# Patient Record
Sex: Female | Born: 1975 | Race: White | Hispanic: No | Marital: Single | Smoking: Former smoker
Health system: Southern US, Community
[De-identification: ages and names within clinical notes are randomized; demographics above are authoritative.]

## PROBLEM LIST (undated history)

## (undated) DIAGNOSIS — E7800 Pure hypercholesterolemia, unspecified: Secondary | ICD-10-CM

## (undated) HISTORY — PX: BACK SURGERY: SHX140

---

## 2010-06-28 ENCOUNTER — Emergency Department (HOSPITAL_COMMUNITY)
Admission: EM | Admit: 2010-06-28 | Discharge: 2010-06-28 | Payer: Self-pay | Source: Home / Self Care | Admitting: Emergency Medicine

## 2010-10-10 ENCOUNTER — Emergency Department (HOSPITAL_COMMUNITY)
Admission: EM | Admit: 2010-10-10 | Discharge: 2010-10-10 | Disposition: A | Discharge status: Home / Self Care | Payer: BC Managed Care – PPO | Attending: Emergency Medicine | Admitting: Emergency Medicine

## 2010-10-10 ENCOUNTER — Emergency Department (HOSPITAL_COMMUNITY): Payer: BC Managed Care – PPO

## 2010-10-10 DIAGNOSIS — E119 Type 2 diabetes mellitus without complications: Secondary | ICD-10-CM | POA: Insufficient documentation

## 2010-10-10 DIAGNOSIS — N390 Urinary tract infection, site not specified: Secondary | ICD-10-CM | POA: Insufficient documentation

## 2010-10-10 DIAGNOSIS — Z87442 Personal history of urinary calculi: Secondary | ICD-10-CM | POA: Insufficient documentation

## 2010-10-10 DIAGNOSIS — R300 Dysuria: Secondary | ICD-10-CM | POA: Insufficient documentation

## 2010-10-10 DIAGNOSIS — R109 Unspecified abdominal pain: Secondary | ICD-10-CM | POA: Insufficient documentation

## 2010-10-10 LAB — POCT I-STAT, CHEM 8
BUN: 8 mg/dL (ref 6–23)
Hemoglobin: 14.6 g/dL (ref 12.0–15.0)
Potassium: 3.7 mEq/L (ref 3.5–5.1)
Sodium: 138 mEq/L (ref 135–145)
TCO2: 26 mmol/L (ref 0–100)

## 2010-10-10 LAB — URINE MICROSCOPIC-ADD ON

## 2010-10-10 LAB — URINALYSIS, ROUTINE W REFLEX MICROSCOPIC
Ketones, ur: NEGATIVE mg/dL
Nitrite: NEGATIVE
Protein, ur: 100 mg/dL — AB
pH: 6.5 (ref 5.0–8.0)

## 2010-10-10 LAB — POCT PREGNANCY, URINE: Preg Test, Ur: NEGATIVE

## 2010-10-12 LAB — URINE CULTURE
Colony Count: >=100000
Culture  Setup Time: 201203182353

## 2010-11-24 ENCOUNTER — Ambulatory Visit: Payer: BC Managed Care – PPO | Admitting: Gynecology

## 2011-03-11 ENCOUNTER — Emergency Department (HOSPITAL_COMMUNITY)
Admission: EM | Admit: 2011-03-11 | Discharge: 2011-03-11 | Disposition: A | Discharge status: Home / Self Care | Payer: Self-pay | Attending: Emergency Medicine | Admitting: Emergency Medicine

## 2011-03-11 DIAGNOSIS — I10 Essential (primary) hypertension: Secondary | ICD-10-CM | POA: Insufficient documentation

## 2011-03-11 DIAGNOSIS — E119 Type 2 diabetes mellitus without complications: Secondary | ICD-10-CM | POA: Insufficient documentation

## 2011-03-11 DIAGNOSIS — M5430 Sciatica, unspecified side: Secondary | ICD-10-CM | POA: Insufficient documentation

## 2011-03-16 ENCOUNTER — Other Ambulatory Visit: Payer: Self-pay | Admitting: Physical Medicine and Rehabilitation

## 2011-03-16 DIAGNOSIS — R29898 Other symptoms and signs involving the musculoskeletal system: Secondary | ICD-10-CM

## 2011-03-16 DIAGNOSIS — R202 Paresthesia of skin: Secondary | ICD-10-CM

## 2011-03-21 ENCOUNTER — Ambulatory Visit
Admission: RE | Admit: 2011-03-21 | Discharge: 2011-03-21 | Disposition: A | Discharge status: Home / Self Care | Payer: Self-pay | Source: Ambulatory Visit | Attending: Physical Medicine and Rehabilitation | Admitting: Physical Medicine and Rehabilitation

## 2011-03-21 DIAGNOSIS — R202 Paresthesia of skin: Secondary | ICD-10-CM

## 2011-03-21 DIAGNOSIS — R29898 Other symptoms and signs involving the musculoskeletal system: Secondary | ICD-10-CM

## 2011-03-21 MED ORDER — GADOBENATE DIMEGLUMINE 529 MG/ML IV SOLN
20.0000 mL | Freq: Once | INTRAVENOUS | Status: AC | PRN
  Administered 2011-03-21: 20 mL via INTRAVENOUS

## 2011-05-21 ENCOUNTER — Emergency Department (HOSPITAL_COMMUNITY)
Admission: EM | Admit: 2011-05-21 | Discharge: 2011-05-21 | Discharge status: Against Medical Advice | Payer: Self-pay | Attending: Emergency Medicine | Admitting: Emergency Medicine

## 2011-05-21 DIAGNOSIS — Z0389 Encounter for observation for other suspected diseases and conditions ruled out: Secondary | ICD-10-CM | POA: Insufficient documentation

## 2011-07-25 ENCOUNTER — Encounter: Payer: Self-pay | Admitting: *Deleted

## 2011-07-25 ENCOUNTER — Emergency Department (HOSPITAL_COMMUNITY)
Admission: EM | Admit: 2011-07-25 | Discharge: 2011-07-25 | Disposition: A | Discharge status: Home / Self Care | Payer: Self-pay | Attending: Emergency Medicine | Admitting: Emergency Medicine

## 2011-07-25 ENCOUNTER — Emergency Department (HOSPITAL_COMMUNITY): Payer: Self-pay

## 2011-07-25 DIAGNOSIS — R112 Nausea with vomiting, unspecified: Secondary | ICD-10-CM | POA: Insufficient documentation

## 2011-07-25 DIAGNOSIS — F17200 Nicotine dependence, unspecified, uncomplicated: Secondary | ICD-10-CM | POA: Insufficient documentation

## 2011-07-25 DIAGNOSIS — H538 Other visual disturbances: Secondary | ICD-10-CM | POA: Insufficient documentation

## 2011-07-25 DIAGNOSIS — G43909 Migraine, unspecified, not intractable, without status migrainosus: Secondary | ICD-10-CM | POA: Insufficient documentation

## 2011-07-25 DIAGNOSIS — E119 Type 2 diabetes mellitus without complications: Secondary | ICD-10-CM | POA: Insufficient documentation

## 2011-07-25 DIAGNOSIS — Z79899 Other long term (current) drug therapy: Secondary | ICD-10-CM | POA: Insufficient documentation

## 2011-07-25 DIAGNOSIS — R42 Dizziness and giddiness: Secondary | ICD-10-CM | POA: Insufficient documentation

## 2011-07-25 DIAGNOSIS — H53149 Visual discomfort, unspecified: Secondary | ICD-10-CM | POA: Insufficient documentation

## 2011-07-25 DIAGNOSIS — R739 Hyperglycemia, unspecified: Secondary | ICD-10-CM

## 2011-07-25 LAB — DIFFERENTIAL
Basophils Relative: 0 % (ref 0–1)
Monocytes Relative: 4 % (ref 3–12)
Neutro Abs: 6.4 10*3/uL (ref 1.7–7.7)
Neutrophils Relative %: 62 % (ref 43–77)

## 2011-07-25 LAB — URINALYSIS, ROUTINE W REFLEX MICROSCOPIC
Bilirubin Urine: NEGATIVE
Glucose, UA: >1000 mg/dL — AB
Hgb urine dipstick: NEGATIVE
Ketones, ur: NEGATIVE mg/dL
Protein, ur: NEGATIVE mg/dL

## 2011-07-25 LAB — URINE MICROSCOPIC-ADD ON

## 2011-07-25 LAB — CBC
Hemoglobin: 13.3 g/dL (ref 12.0–15.0)
MCHC: 32.6 g/dL (ref 30.0–36.0)
RBC: 4.37 MIL/uL (ref 3.87–5.11)
WBC: 10.3 10*3/uL (ref 4.0–10.5)

## 2011-07-25 LAB — BASIC METABOLIC PANEL
BUN: 10 mg/dL (ref 6–23)
Chloride: 100 mEq/L (ref 96–112)
GFR calc Af Amer: >90 mL/min (ref >90)
Potassium: 4 mEq/L (ref 3.5–5.1)

## 2011-07-25 LAB — GLUCOSE, CAPILLARY: Glucose-Capillary: 282 mg/dL — ABNORMAL HIGH (ref 70–99)

## 2011-07-25 LAB — POCT PREGNANCY, URINE: Preg Test, Ur: NEGATIVE

## 2011-07-25 MED ORDER — SODIUM CHLORIDE 0.9 % IV BOLUS (SEPSIS)
1000.0000 mL | Freq: Once | INTRAVENOUS | Status: AC
  Administered 2011-07-25: 1000 mL via INTRAVENOUS

## 2011-07-25 MED ORDER — DIPHENHYDRAMINE HCL 50 MG/ML IJ SOLN
25.0000 mg | Freq: Once | INTRAMUSCULAR | Status: AC
  Administered 2011-07-25: 25 mg via INTRAVENOUS
  Filled 2011-07-25: qty 1

## 2011-07-25 MED ORDER — INSULIN REGULAR HUMAN 100 UNIT/ML IJ SOLN: 10.0000 [IU] | Freq: Once | INTRAMUSCULAR | Status: DC

## 2011-07-25 MED ORDER — METOCLOPRAMIDE HCL 5 MG/ML IJ SOLN
10.0000 mg | Freq: Once | INTRAMUSCULAR | Status: AC
  Administered 2011-07-25: 10 mg via INTRAVENOUS
  Filled 2011-07-25: qty 2

## 2011-07-25 MED ORDER — INSULIN ASPART 100 UNIT/ML ~~LOC~~ SOLN
10.0000 [IU] | Freq: Once | SUBCUTANEOUS | Status: AC
  Administered 2011-07-25: 10 [IU] via SUBCUTANEOUS
  Filled 2011-07-25: qty 1

## 2011-07-25 NOTE — ED Notes (Signed)
The pts blood sugar has been running high for the past week.  Last pm it was 500 .  She does not take insulin.  C/o a headache.

## 2011-07-25 NOTE — ED Notes (Signed)
CBG results: 360

## 2011-07-25 NOTE — ED Notes (Signed)
Pt st's headache is getting better at this time.

## 2011-07-25 NOTE — ED Notes (Signed)
The pt returned from c-t iv not running.  Repositioned iv running now.  Headache better but not completely gone

## 2011-07-25 NOTE — ED Notes (Signed)
Reports high cbg. Having headache, dizziness, blurred vision that started last night. cbg 360 at triage. No acute distress noted at triage.

## 2011-07-25 NOTE — ED Notes (Signed)
Med given for pain .  Pt taken to c-t

## 2011-07-25 NOTE — ED Provider Notes (Signed)
History     CSN: [id number]  Arrival date & time 07/25/11  1334   First MD Initiated Contact with Patient 07/25/11 1729      Chief Complaint  Patient presents with  . Hyperglycemia  . Headache  . Dizziness    (Consider location/radiation/quality/duration/timing/severity/associated sxs/prior treatment) HPI Comments: Also her blood sugar has been running high over the last 2-3 days despite continuing to take her metformin.  Patient is a 35 y.o. female presenting with headaches. The history is provided by the patient.  Headache  This is a new problem. The current episode started 2 days ago. The problem occurs constantly. The problem has been gradually worsening. The headache is associated with bright light and loud noise. The pain is located in the right unilateral region. The quality of the pain is described as throbbing. The pain is at a severity of 8/10. The pain is moderate. The pain does not radiate. Associated symptoms include nausea and vomiting. Pertinent negatives include no fever, no near-syncope, no orthopnea, no syncope and no shortness of breath. Associated symptoms comments: Mild dizziness worse with standing. She has tried nothing for the symptoms. The treatment provided no relief.    Past Medical History  Diagnosis Date  . Diabetes mellitus     History reviewed. No pertinent past surgical history.  History reviewed. No pertinent family history.  History  Substance Use Topics  . Smoking status: Current Everyday Smoker -- 0.5 packs/day    Types: Cigarettes  . Smokeless tobacco: Not on file  . Alcohol Use: No    OB History    Grav Para Term Preterm Abortions TAB SAB Ect Mult Living                  Review of Systems  Constitutional: Negative for fever.  Eyes:       Intermittent blurriness in the right eye normal vision current  Respiratory: Negative for shortness of breath.   Cardiovascular: Negative for orthopnea, syncope and near-syncope.    Gastrointestinal: Positive for nausea and vomiting.  Neurological: Positive for headaches.  All other systems reviewed and are negative.    Allergies  Relafen  Home Medications   Current Outpatient Rx  Name Route Sig Dispense Refill  . LISINOPRIL 10 MG PO TABS Oral Take 10 mg by mouth daily.      Marland Kitchen METFORMIN HCL 1000 MG PO TABS Oral Take 1,000 mg by mouth 2 (two) times daily with a meal.      . NAPROXEN SODIUM 220 MG PO TABS Oral Take 440 mg by mouth 3 (three) times daily as needed. For pain       BP 125/75  Pulse 92  Temp(Src) 98 F (36.7 C) (Oral)  Resp 20  SpO2 98%  LMP 06/18/2011  Physical Exam  Nursing note and vitals reviewed. Constitutional: She is oriented to person, place, and time. She appears well-developed and well-nourished. She appears distressed.  HENT:  Head: Normocephalic and atraumatic.  Eyes: EOM are normal. Pupils are equal, round, and reactive to light.  Fundoscopic exam:      The right eye shows no papilledema.       The left eye shows no papilledema.  Neck: Normal range of motion. Neck supple.  Cardiovascular: Normal rate, regular rhythm, normal heart sounds and intact distal pulses.  Exam reveals no friction rub.   No murmur heard. Pulmonary/Chest: Effort normal and breath sounds normal. She has no wheezes. She has no rales.  Abdominal: Soft. Bowel sounds  are normal. She exhibits no distension. There is no tenderness. There is no rebound and no guarding.  Musculoskeletal: Normal range of motion. She exhibits no tenderness.       No edema  Lymphadenopathy:    She has no cervical adenopathy.  Neurological: She is alert and oriented to person, place, and time. She has normal strength. No cranial nerve deficit or sensory deficit. Gait normal.       photophobia  Skin: Skin is warm and dry. No rash noted.  Psychiatric: She has a normal mood and affect. Her behavior is normal.    ED Course  Procedures (including critical care time)  Labs  Reviewed  GLUCOSE, CAPILLARY - Abnormal; Notable for the following:    Glucose-Capillary 360 (*)    All other components within normal limits  URINALYSIS, ROUTINE W REFLEX MICROSCOPIC - Abnormal; Notable for the following:    APPearance HAZY (*)    Specific Gravity, Urine >1.046 (*) REPEATED TO VERIFY   Glucose, UA >1000 (*)    All other components within normal limits  URINE MICROSCOPIC-ADD ON - Abnormal; Notable for the following:    Squamous Epithelial / LPF MANY (*)    Bacteria, UA FEW (*)    All other components within normal limits  BASIC METABOLIC PANEL - Abnormal; Notable for the following:    Glucose, Bld 351 (*)    All other components within normal limits  POCT PREGNANCY, URINE  CBC  DIFFERENTIAL  POCT PREGNANCY, URINE   Ct Head Wo Contrast  07/25/2011  *RADIOLOGY REPORT*  Clinical Data: 35 year old female with headache, dizziness, blurred vision.  CT HEAD WITHOUT CONTRAST  Technique:  Contiguous axial images were obtained from the base of the skull through the vertex without contrast.  Comparison: None.  Findings: Visualized paranasal sinuses and mastoids are clear. Visualized orbits and scalp soft tissues are within normal limits. No acute osseous abnormality identified.  Dural calcifications.  Cerebral volume is within normal limits for age.  No midline shift, ventriculomegaly, mass effect, evidence of mass lesion, intracranial hemorrhage or evidence of cortically based acute infarction.  Gray-white matter differentiation is within normal limits throughout the brain.  No suspicious intracranial vascular hyperdensity.  IMPRESSION: Normal noncontrast CT appearance of the brain.  Original Report Authenticated By: Harley Hallmark, M.D.     No diagnosis found.    MDM   Pt with typical migraine HA without sx suggestive of SAH(sudden onset, worst of life, or deficits), infection, or cavernous vein thrombosis.  Normal neuro exam and vital signs. Will give HA cocktail and on  re-eval. also her blood sugar has been running high but states that she has not had any infectious symptoms, abdominal pain, vomiting, diarrhea or neck pain. She states that she is stuck with her diet and is unclear why her sugars are high. She states however when her sugar gets high she gets lightheaded which she is experiencing today. No syncope or chest pain. CBC, BMP within normal limits other than hyperglycemia. A which was a contaminated specimen but no signs of UTI.  8:14 PM After headache cocktail patient feeling much better given 10 units of insulin repeat blood sugar was        Gwyneth Sprout, MD 07/25/11 2017

## 2012-01-01 ENCOUNTER — Emergency Department (INDEPENDENT_AMBULATORY_CARE_PROVIDER_SITE_OTHER)
Admission: EM | Admit: 2012-01-01 | Discharge: 2012-01-01 | Disposition: A | Discharge status: Home / Self Care | Payer: Self-pay | Source: Home / Self Care | Attending: Family Medicine | Admitting: Family Medicine

## 2012-01-01 ENCOUNTER — Encounter (HOSPITAL_COMMUNITY): Payer: Self-pay | Admitting: *Deleted

## 2012-01-01 DIAGNOSIS — Z760 Encounter for issue of repeat prescription: Secondary | ICD-10-CM

## 2012-01-01 DIAGNOSIS — E119 Type 2 diabetes mellitus without complications: Secondary | ICD-10-CM

## 2012-01-01 DIAGNOSIS — R739 Hyperglycemia, unspecified: Secondary | ICD-10-CM

## 2012-01-01 DIAGNOSIS — R7309 Other abnormal glucose: Secondary | ICD-10-CM

## 2012-01-01 LAB — POCT I-STAT, CHEM 8
BUN: 6 mg/dL (ref 6–23)
Creatinine, Ser: 0.6 mg/dL (ref 0.50–1.10)
Glucose, Bld: 238 mg/dL — ABNORMAL HIGH (ref 70–99)
Hemoglobin: 14.6 g/dL (ref 12.0–15.0)
Potassium: 4 mEq/L (ref 3.5–5.1)

## 2012-01-01 MED ORDER — METFORMIN HCL 1000 MG PO TABS: 1000.0000 mg | ORAL_TABLET | Freq: Two times a day (BID) | ORAL | Status: DC

## 2012-01-01 MED ORDER — NAPROXEN SODIUM 220 MG PO TABS: 440.0000 mg | ORAL_TABLET | Freq: Three times a day (TID) | ORAL | Status: DC | PRN

## 2012-01-01 MED ORDER — LISINOPRIL 10 MG PO TABS: 10.0000 mg | ORAL_TABLET | Freq: Every day | ORAL | Status: DC

## 2012-01-01 MED ORDER — GLIPIZIDE 10 MG PO TABS: 10.0000 mg | ORAL_TABLET | Freq: Two times a day (BID) | ORAL | Status: DC

## 2012-01-01 NOTE — Discharge Instructions (Signed)

## 2012-01-01 NOTE — ED Notes (Signed)
Pt is here with complaints of hyperglycemia.  Lost her insurance in Feb. and has been out of her metformin and glipizide X 3 days.  Pt recently got her insurance back and has made the first available appointment with her PCP in August.  Reports CBG at 11am was 265.

## 2012-01-02 NOTE — ED Provider Notes (Signed)
History     CSN: [id number]  Arrival date & time 01/01/12  1153   First MD Initiated Contact with Patient 01/01/12 1206      Chief Complaint  Patient presents with  . Hyperglycemia    (Consider location/radiation/quality/duration/timing/severity/associated sxs/prior treatment) HPI Comments: 36 y/o obese female h/o diabetes here concerned about blood sugar raise. States she ran out of her medication 3 days ago as she lost her insurance and was not able to see her doctor for refills. Has checked blood sugar at home and has been about 250 consistently. Has had intermittent headache. Otherwise feeling OK. Denies fever or chills, no dizziness, abdominal pain, nausea, vomiting or diarrhea. No dysuria, polyuria or increased thirst.  She wants refills off her medications to last until next doctor appt. In August (patient does have a new job a got her medical insurance back)    Past Medical History  Diagnosis Date  . Diabetes mellitus     Past Surgical History  Procedure Date  . Back surgery     History reviewed. No pertinent family history.  History  Substance Use Topics  . Smoking status: Current Everyday Smoker -- 0.5 packs/day    Types: Cigarettes  . Smokeless tobacco: Not on file  . Alcohol Use: Yes    OB History    Grav Para Term Preterm Abortions TAB SAB Ect Mult Living                  Review of Systems  Constitutional: Negative for fever, chills, appetite change and fatigue.       10 systems reviewed and  pertinent negative and positive symptoms are as per HPI.     Eyes: Negative for visual disturbance.  Gastrointestinal: Negative for nausea, vomiting, abdominal pain and diarrhea.  Genitourinary: Negative for dysuria and frequency.  Musculoskeletal: Negative for myalgias.  Skin: Negative for rash.  Neurological: Negative for dizziness and light-headedness.  All other systems reviewed and are negative.    Allergies  Penicillins and Relafen  Home Medications     Current Outpatient Rx  Name Route Sig Dispense Refill  . GLIPIZIDE 10 MG PO TABS Oral Take 1 tablet (10 mg total) by mouth 2 (two) times daily before a meal. 60 tablet 2  . LISINOPRIL 10 MG PO TABS Oral Take 1 tablet (10 mg total) by mouth daily. 30 tablet 2  . METFORMIN HCL 1000 MG PO TABS Oral Take 1 tablet (1,000 mg total) by mouth 2 (two) times daily with a meal. 60 tablet 2  . NAPROXEN SODIUM 220 MG PO TABS Oral Take 2 tablets (440 mg total) by mouth 3 (three) times daily as needed. For pain 30 tablet 2    BP 151/100  Pulse 92  Temp(Src) 98.6 F (37 C) (Oral)  Resp 22  SpO2 98%  LMP 12/06/2011  Physical Exam  Nursing note and vitals reviewed. Constitutional: She is oriented to person, place, and time. She appears well-developed and well-nourished. No distress.       obese  HENT:  Head: Normocephalic and atraumatic.  Mouth/Throat: No oropharyngeal exudate.  Eyes: Conjunctivae and EOM are normal. Pupils are equal, round, and reactive to light. No scleral icterus.  Neck: Neck supple. No JVD present. No thyromegaly present.  Cardiovascular: Normal rate, regular rhythm, normal heart sounds and intact distal pulses.  Exam reveals no gallop and no friction rub.   No murmur heard. Pulmonary/Chest: Effort normal and breath sounds normal.  Abdominal: Soft. Bowel sounds are normal.  She exhibits no distension and no mass. There is no tenderness.  Lymphadenopathy:    She has no cervical adenopathy.  Neurological: She is alert and oriented to person, place, and time.       Intact sensation in lower extremities including feet and soles.  Skin: No rash noted.       No foot sores or signs of infection.    ED Course  Procedures (including critical care time)  Labs Reviewed  POCT I-STAT, CHEM 8 - Abnormal; Notable for the following:    Glucose, Bld 238 (*)    All other components within normal limits   No results found.   1. Hyperglycemia without ketosis   2. Diabetes mellitus    3. Medication refill       MDM  Hyperglycemia, normal electrolytes. No signs of dehydration. Clinically well. Refilled her medications. Patient of childbearing age. Discussed with patient contraindication and risk for fetal malformations when taking ACE inhibitors during pregnancy. Patient is not pregnant. She will discuss this issue on her next visit with her PCP for when she starts trying to conceive. Currently reports using condoms consistently. She requested to have her lisinopril refilled.         Sharin Grave, MD 01/02/12 1229

## 2012-11-26 ENCOUNTER — Encounter: Payer: Self-pay | Admitting: *Deleted

## 2012-11-26 NOTE — Telephone Encounter (Deleted)
Needs 90 day supply on all

## 2012-12-07 NOTE — Telephone Encounter (Signed)
This encounter was created in error - please disregard.

## 2012-12-31 ENCOUNTER — Emergency Department (HOSPITAL_COMMUNITY)
Admission: EM | Admit: 2012-12-31 | Discharge: 2012-12-31 | Disposition: A | Discharge status: Home / Self Care | Payer: 59 | Attending: Emergency Medicine | Admitting: Emergency Medicine

## 2012-12-31 ENCOUNTER — Encounter (HOSPITAL_COMMUNITY): Payer: Self-pay | Admitting: Family Medicine

## 2012-12-31 ENCOUNTER — Emergency Department (HOSPITAL_COMMUNITY): Payer: 59

## 2012-12-31 DIAGNOSIS — R000 Tachycardia, unspecified: Secondary | ICD-10-CM | POA: Insufficient documentation

## 2012-12-31 DIAGNOSIS — E119 Type 2 diabetes mellitus without complications: Secondary | ICD-10-CM | POA: Insufficient documentation

## 2012-12-31 DIAGNOSIS — F17200 Nicotine dependence, unspecified, uncomplicated: Secondary | ICD-10-CM | POA: Insufficient documentation

## 2012-12-31 DIAGNOSIS — Z79899 Other long term (current) drug therapy: Secondary | ICD-10-CM | POA: Insufficient documentation

## 2012-12-31 DIAGNOSIS — R197 Diarrhea, unspecified: Secondary | ICD-10-CM | POA: Insufficient documentation

## 2012-12-31 DIAGNOSIS — R55 Syncope and collapse: Secondary | ICD-10-CM | POA: Insufficient documentation

## 2012-12-31 DIAGNOSIS — R1110 Vomiting, unspecified: Secondary | ICD-10-CM | POA: Insufficient documentation

## 2012-12-31 DIAGNOSIS — Z3202 Encounter for pregnancy test, result negative: Secondary | ICD-10-CM | POA: Insufficient documentation

## 2012-12-31 DIAGNOSIS — Z880 Allergy status to penicillin: Secondary | ICD-10-CM | POA: Insufficient documentation

## 2012-12-31 LAB — CBC WITH DIFFERENTIAL/PLATELET
Basophils Relative: 0 % (ref 0–1)
Eosinophils Absolute: 0.1 10*3/uL (ref 0.0–0.7)
Eosinophils Relative: 1 % (ref 0–5)
HCT: 38.8 % (ref 36.0–46.0)
Hemoglobin: 12.9 g/dL (ref 12.0–15.0)
Lymphs Abs: 3 10*3/uL (ref 0.7–4.0)
MCH: 30.6 pg (ref 26.0–34.0)
MCHC: 33.2 g/dL (ref 30.0–36.0)
MCV: 92.2 fL (ref 78.0–100.0)
Monocytes Absolute: 0.8 10*3/uL (ref 0.1–1.0)
Monocytes Relative: 6 % (ref 3–12)

## 2012-12-31 LAB — POCT PREGNANCY, URINE: Preg Test, Ur: NEGATIVE

## 2012-12-31 LAB — POCT I-STAT TROPONIN I: Troponin i, poc: 0 ng/mL (ref 0.00–0.08)

## 2012-12-31 LAB — COMPREHENSIVE METABOLIC PANEL
Albumin: 3.7 g/dL (ref 3.5–5.2)
BUN: 20 mg/dL (ref 6–23)
Creatinine, Ser: 1.07 mg/dL (ref 0.50–1.10)
GFR calc Af Amer: 77 mL/min — ABNORMAL LOW (ref >90)
Glucose, Bld: 144 mg/dL — ABNORMAL HIGH (ref 70–99)
Total Protein: 7 g/dL (ref 6.0–8.3)

## 2012-12-31 LAB — URINALYSIS, ROUTINE W REFLEX MICROSCOPIC
Glucose, UA: >1000 mg/dL — AB
Ketones, ur: NEGATIVE mg/dL
Nitrite: NEGATIVE
Protein, ur: NEGATIVE mg/dL
pH: 6 (ref 5.0–8.0)

## 2012-12-31 LAB — GLUCOSE, CAPILLARY

## 2012-12-31 LAB — URINE MICROSCOPIC-ADD ON

## 2012-12-31 MED ORDER — METHOCARBAMOL 500 MG PO TABS: 500.0000 mg | ORAL_TABLET | Freq: Two times a day (BID) | ORAL | Status: DC

## 2012-12-31 MED ORDER — SODIUM CHLORIDE 0.9 % IV BOLUS (SEPSIS)
1000.0000 mL | Freq: Once | INTRAVENOUS | Status: AC
  Administered 2012-12-31: 1000 mL via INTRAVENOUS

## 2012-12-31 MED ORDER — SODIUM CHLORIDE 0.9 % IV SOLN
INTRAVENOUS | Status: DC
  Administered 2012-12-31: 16:00:00 via INTRAVENOUS

## 2012-12-31 MED ORDER — HYDROCODONE-ACETAMINOPHEN 5-325 MG PO TABS
2.0000 | ORAL_TABLET | ORAL | Status: DC | PRN
Start: 2012-12-31 — End: 2013-05-17

## 2012-12-31 NOTE — ED Provider Notes (Signed)
History     CSN: [id number]  Arrival date & time 12/31/12  1352   First MD Initiated Contact with Patient 12/31/12 1528      Chief Complaint  Patient presents with  . Abdominal Pain    (Consider location/radiation/quality/duration/timing/severity/associated sxs/prior treatment) Patient is a 37 y.o. female presenting with abdominal pain. The history is provided by the patient.  Abdominal Pain Associated symptoms include abdominal pain.   patient here complaining of a syncopal event prior to arrival. Has been feeling dizzy when she stands up for the past 2-3 days. Developed nonbilious vomiting and watery diarrhea today. Went home from work and when she stood up she had a brief syncopal event. No loss of bowel or function. No tongue damage. Symptoms are okay which is lying flat and worse or she stands. She has had constant passive low blood sugars while she is a diabetic and does take insulin. Denies any recent shortness of breath or chest pain or abdominal pain. No vaginal bleeding or discharge. No treatment used prior to arrival.  Past Medical History  Diagnosis Date  . Diabetes mellitus     Past Surgical History  Procedure Laterality Date  . Back surgery      History reviewed. No pertinent family history.  History  Substance Use Topics  . Smoking status: Current Every Day Smoker -- 0.50 packs/day    Types: Cigarettes  . Smokeless tobacco: Not on file  . Alcohol Use: Yes    OB History   Grav Para Term Preterm Abortions TAB SAB Ect Mult Living                  Review of Systems  Gastrointestinal: Positive for abdominal pain.  All other systems reviewed and are negative.    Allergies  Penicillins and Relafen  Home Medications   Current Outpatient Rx  Name  Route  Sig  Dispense  Refill  . Cholecalciferol (VITAMIN D PO)   Oral   Take 1 capsule by mouth daily.         . insulin detemir (LEVEMIR) 100 UNIT/ML injection   Subcutaneous   Inject 10 Units into  the skin at bedtime.         . Liraglutide (VICTOZA) 18 MG/3ML SOPN   Subcutaneous   Inject 18 mg into the skin daily.         . metFORMIN (GLUCOPHAGE) 1000 MG tablet   Oral   Take 1 tablet (1,000 mg total) by mouth 2 (two) times daily with a meal.   60 tablet   2   . Olmesartan-Amlodipine-HCTZ (TRIBENZOR) 40-10-12.5 MG TABS   Oral   Take 1 tablet by mouth daily.         . rosuvastatin (CRESTOR) 10 MG tablet   Oral   Take 10 mg by mouth daily.           BP 106/57  Pulse 112  Temp(Src) 97.9 F (36.6 C) (Oral)  Resp 20  SpO2 97%  LMP 12/20/2012  Physical Exam  Nursing note and vitals reviewed. Constitutional: She is oriented to person, place, and time. She appears well-developed and well-nourished.  Non-toxic appearance. No distress.  HENT:  Head: Normocephalic and atraumatic.  Eyes: Conjunctivae, EOM and lids are normal. Pupils are equal, round, and reactive to light.  Neck: Normal range of motion. Neck supple. No tracheal deviation present. No mass present.  Cardiovascular: Regular rhythm and normal heart sounds.  Tachycardia present.  Exam reveals no gallop.  No murmur heard. Pulmonary/Chest: Effort normal and breath sounds normal. No stridor. No respiratory distress. She has no decreased breath sounds. She has no wheezes. She has no rhonchi. She has no rales.    Abdominal: Soft. Normal appearance and bowel sounds are normal. She exhibits no distension. There is no tenderness. There is no rebound and no CVA tenderness.  Musculoskeletal: Normal range of motion. She exhibits no edema and no tenderness.  Neurological: She is alert and oriented to person, place, and time. She has normal strength. No cranial nerve deficit or sensory deficit. GCS eye subscore is 4. GCS verbal subscore is 5. GCS motor subscore is 6.  Skin: Skin is warm and dry. No abrasion and no rash noted.  Psychiatric: She has a normal mood and affect. Her speech is normal and behavior is normal.      ED Course  Procedures (including critical care time)  Labs Reviewed  CBC WITH DIFFERENTIAL - Abnormal; Notable for the following:    WBC 14.6 (*)    RDW 15.7 (*)    Neutro Abs 10.7 (*)    All other components within normal limits  COMPREHENSIVE METABOLIC PANEL - Abnormal; Notable for the following:    Sodium 134 (*)    Glucose, Bld 144 (*)    GFR calc non Af Amer 66 (*)    GFR calc Af Amer 77 (*)    All other components within normal limits  URINALYSIS, ROUTINE W REFLEX MICROSCOPIC  POCT I-STAT TROPONIN I  POCT PREGNANCY, URINE   Dg Chest 2 View  12/31/2012   *RADIOLOGY REPORT*  Clinical Data: Larey Seat, left-sided chest pain.  CHEST - 2 VIEW  Comparison: None.  Findings: Normal heart size, with clear lung fields.  No bony abnormality.  No effusion or pneumothorax.  IMPRESSION: Negative chest.  Specifically no left-sided rib fractures or left hemithorax pathology is evident.   Original Report Authenticated By: Davonna Belling, M.D.     No diagnosis found.    MDM  Pt with likely vagal episode from volume depletion--pt given iv fluids and repeat orthostatics are improved--pt stable for discharge        Toy Baker, MD 12/31/12 [phone]

## 2012-12-31 NOTE — ED Notes (Signed)
Per pt sts nausea, vomiting, and diarrhea since yesterday. sts she had a syncopal episode this am and fell and possibly broke left rib. Denies chest pain, SOB

## 2012-12-31 NOTE — ED Notes (Signed)
Pt resting comfortably in bed. Denies nausea at this time. Family at bedside.

## 2012-12-31 NOTE — ED Notes (Signed)
Urine sample requested.  Patient unable to void at this time. 

## 2013-01-01 LAB — URINE CULTURE: Colony Count: >=100000

## 2013-05-17 ENCOUNTER — Emergency Department (HOSPITAL_COMMUNITY)
Admission: EM | Admit: 2013-05-17 | Discharge: 2013-05-17 | Disposition: A | Discharge status: Home / Self Care | Payer: 59 | Attending: Emergency Medicine | Admitting: Emergency Medicine

## 2013-05-17 ENCOUNTER — Encounter (HOSPITAL_COMMUNITY): Payer: Self-pay | Admitting: Emergency Medicine

## 2013-05-17 DIAGNOSIS — E119 Type 2 diabetes mellitus without complications: Secondary | ICD-10-CM | POA: Insufficient documentation

## 2013-05-17 DIAGNOSIS — Z880 Allergy status to penicillin: Secondary | ICD-10-CM | POA: Insufficient documentation

## 2013-05-17 DIAGNOSIS — M545 Low back pain, unspecified: Secondary | ICD-10-CM

## 2013-05-17 DIAGNOSIS — G8928 Other chronic postprocedural pain: Secondary | ICD-10-CM | POA: Insufficient documentation

## 2013-05-17 DIAGNOSIS — F17200 Nicotine dependence, unspecified, uncomplicated: Secondary | ICD-10-CM | POA: Insufficient documentation

## 2013-05-17 DIAGNOSIS — E7800 Pure hypercholesterolemia, unspecified: Secondary | ICD-10-CM | POA: Insufficient documentation

## 2013-05-17 DIAGNOSIS — Z79899 Other long term (current) drug therapy: Secondary | ICD-10-CM | POA: Insufficient documentation

## 2013-05-17 HISTORY — DX: Pure hypercholesterolemia, unspecified: E78.00

## 2013-05-17 MED ORDER — HYDROMORPHONE HCL PF 1 MG/ML IJ SOLN
1.0000 mg | Freq: Once | INTRAMUSCULAR | Status: AC
  Administered 2013-05-17: 1 mg via INTRAMUSCULAR
  Filled 2013-05-17: qty 1

## 2013-05-17 MED ORDER — HYDROCODONE-ACETAMINOPHEN 5-325 MG PO TABS: 2.0000 | ORAL_TABLET | ORAL | Status: AC | PRN

## 2013-05-17 NOTE — ED Provider Notes (Signed)
CSN: [id number]     Arrival date & time 05/17/13  1316 History   First MD Initiated Contact with Patient 05/17/13 1349     Chief Complaint  Patient presents with  . Back Pain   (Consider location/radiation/quality/duration/timing/severity/associated sxs/prior Treatment) HPI  37 year old female with history of back surgery presents complaining of low back pain. Patient reports that surgery back in 2011 due to  herniated disc. Reportedly every year around this time she usually developed low back pain, usually relieved after receiving a shot of prednisone. She has been developing gradual onset of sharp low back pain, nonradiating, 8/10, worsening with movement for the past 2 days. She has tried home medication without relief. She called and spoke with the orthopedic Dr. but unable to get an appointment until Monday at 9 AM. Heart Dr. recommend patient to come to ER for pain control. Patient otherwise denies fever, chills, headache, lightheadedness, dizziness, chest pain, shortness of breath, abdominal pain, dysuria, urinary all bowel incontinence, or saddle paresthesia. No history of IV drug use no history of cancer. No recent trauma. She is able to ambulate, although difficult due to pain.   Past Medical History  Diagnosis Date  . Diabetes mellitus   . Hypercholesteremia    Past Surgical History  Procedure Laterality Date  . Back surgery    . Back surgery     No family history on file. History  Substance Use Topics  . Smoking status: Current Every Day Smoker -- 0.50 packs/day    Types: Cigarettes  . Smokeless tobacco: Not on file  . Alcohol Use: Yes   OB History   Grav Para Term Preterm Abortions TAB SAB Ect Mult Living                 Review of Systems  Constitutional: Negative for fever.  Gastrointestinal: Negative for abdominal pain.  Genitourinary: Negative for dysuria.  Musculoskeletal: Positive for back pain.  Skin: Negative for rash and wound.  Neurological: Negative  for numbness.    Allergies  Penicillins and Relafen  Home Medications   Current Outpatient Rx  Name  Route  Sig  Dispense  Refill  . ibuprofen (ADVIL,MOTRIN) 800 MG tablet   Oral   Take 1,600 mg by mouth every 8 (eight) hours as needed for pain.         . Liraglutide (VICTOZA) 18 MG/3ML SOPN   Subcutaneous   Inject 18 mg into the skin daily.         . metFORMIN (GLUCOPHAGE) 1000 MG tablet   Oral   Take 1 tablet (1,000 mg total) by mouth 2 (two) times daily with a meal.   60 tablet   2   . Olmesartan-Amlodipine-HCTZ (TRIBENZOR) 40-10-12.5 MG TABS   Oral   Take 1 tablet by mouth daily.         . rosuvastatin (CRESTOR) 10 MG tablet   Oral   Take 10 mg by mouth daily.          BP 135/73  Pulse 103  Temp(Src) 98.3 F (36.8 C) (Oral)  Resp 20  Wt 287 lb (130.182 kg)  SpO2 97%  LMP 05/05/2013 Physical Exam  Nursing note and vitals reviewed. Constitutional:  Patient is morbidly obese, appears to be in no acute distress.  HENT:  Head: Atraumatic.  Eyes: Conjunctivae are normal.  Neck: Neck supple.  Abdominal: Soft. There is no tenderness.  Musculoskeletal: She exhibits tenderness (Lumbar and right paralumbar tenderness without significant midline spine tenderness, crepitus or  step-off noted. No overlying skin changes.).  Neurological: She is alert.  Difficult to elicit patellar deep tendon reflex due to large body habitus however no foot drop and patient able to ambulate with some difficulty.  Skin: Skin is warm. No rash noted.  Psychiatric: She has a normal mood and affect.    ED Course  Procedures (including critical care time)  2:01 PM Pt here with acute on chronic low back pain.  No red flags.  Pain medication given.  Pt able to ambulate.  She will be able to f/u with orthopedist from [facility] on Monday.   3:09 PM Improvement of pain, pt able to ambulate, stable for discharge.    Labs Review Labs Reviewed - No data to display Imaging  Review No results found.  EKG Interpretation   None       MDM   1. Low back pain    BP 135/73  Pulse 103  Temp(Src) 98.3 F (36.8 C) (Oral)  Resp 20  Wt 287 lb (130.182 kg)  SpO2 97%  LMP 05/05/2013    Fayrene Helper, PA-C 05/17/13 [id number]

## 2013-05-17 NOTE — ED Notes (Signed)
Patient with lower back pain since yesterday.  She denies injury but has had surgery on her back in 2011 for herniated discs she thinks L4 and L5.  Patient reports every year this pain occurs and she has to get a shot of prednisone.  She called her ortho MD but they could not see her until Monday.  Patient is having difficulty walking and moving.  Rates pain as a nine.

## 2013-05-24 NOTE — ED Provider Notes (Signed)
Medical screening examination/treatment/procedure(s) were performed by non-physician practitioner and as supervising physician I was immediately available for consultation/collaboration.  EKG Interpretation   None         Roney Marion, MD 05/24/13 [phone]

## 2013-12-13 ENCOUNTER — Ambulatory Visit: Payer: Self-pay

## 2014-02-22 ENCOUNTER — Emergency Department (HOSPITAL_COMMUNITY): Payer: 59

## 2014-02-22 ENCOUNTER — Encounter (HOSPITAL_COMMUNITY): Payer: Self-pay | Admitting: Emergency Medicine

## 2014-02-22 ENCOUNTER — Emergency Department (HOSPITAL_COMMUNITY)
Admission: EM | Admit: 2014-02-22 | Discharge: 2014-02-23 | Disposition: A | Discharge status: Home / Self Care | Payer: 59 | Attending: Emergency Medicine | Admitting: Emergency Medicine

## 2014-02-22 DIAGNOSIS — S93402A Sprain of unspecified ligament of left ankle, initial encounter: Secondary | ICD-10-CM

## 2014-02-22 DIAGNOSIS — Z880 Allergy status to penicillin: Secondary | ICD-10-CM | POA: Diagnosis not present

## 2014-02-22 DIAGNOSIS — W108XXA Fall (on) (from) other stairs and steps, initial encounter: Secondary | ICD-10-CM | POA: Insufficient documentation

## 2014-02-22 DIAGNOSIS — E119 Type 2 diabetes mellitus without complications: Secondary | ICD-10-CM | POA: Diagnosis not present

## 2014-02-22 DIAGNOSIS — Z79899 Other long term (current) drug therapy: Secondary | ICD-10-CM | POA: Diagnosis not present

## 2014-02-22 DIAGNOSIS — Z87891 Personal history of nicotine dependence: Secondary | ICD-10-CM | POA: Insufficient documentation

## 2014-02-22 DIAGNOSIS — Y9289 Other specified places as the place of occurrence of the external cause: Secondary | ICD-10-CM | POA: Diagnosis not present

## 2014-02-22 DIAGNOSIS — E7800 Pure hypercholesterolemia, unspecified: Secondary | ICD-10-CM | POA: Diagnosis not present

## 2014-02-22 DIAGNOSIS — S93409A Sprain of unspecified ligament of unspecified ankle, initial encounter: Secondary | ICD-10-CM | POA: Insufficient documentation

## 2014-02-22 DIAGNOSIS — Y9389 Activity, other specified: Secondary | ICD-10-CM | POA: Insufficient documentation

## 2014-02-22 DIAGNOSIS — M25579 Pain in unspecified ankle and joints of unspecified foot: Secondary | ICD-10-CM | POA: Insufficient documentation

## 2014-02-22 MED ORDER — NAPROXEN 500 MG PO TABS: 500.0000 mg | ORAL_TABLET | Freq: Two times a day (BID) | ORAL | Status: AC

## 2014-02-22 NOTE — Discharge Instructions (Signed)
Keep ankle elevated. Stay off of it as much as able. Ice several times a day. ASO brace when walking around. Follow up with orthopedics.    Ankle Sprain An ankle sprain is an injury to the strong, fibrous tissues (ligaments) that hold the bones of your ankle joint together.  CAUSES An ankle sprain is usually caused by a fall or by twisting your ankle. Ankle sprains most commonly occur when you step on the outer edge of your foot, and your ankle turns inward. People who participate in sports are more prone to these types of injuries.  SYMPTOMS   Pain in your ankle. The pain may be present at rest or only when you are trying to stand or walk.  Swelling.  Bruising. Bruising may develop immediately or within 1 to 2 days after your injury.  Difficulty standing or walking, particularly when turning corners or changing directions. DIAGNOSIS  Your caregiver will ask you details about your injury and perform a physical exam of your ankle to determine if you have an ankle sprain. During the physical exam, your caregiver will press on and apply pressure to specific areas of your foot and ankle. Your caregiver will try to move your ankle in certain ways. An X-ray exam may be done to be sure a bone was not broken or a ligament did not separate from one of the bones in your ankle (avulsion fracture).  TREATMENT  Certain types of braces can help stabilize your ankle. Your caregiver can make a recommendation for this. Your caregiver may recommend the use of medicine for pain. If your sprain is severe, your caregiver may refer you to a surgeon who helps to restore function to parts of your skeletal system (orthopedist) or a physical therapist. HOME CARE INSTRUCTIONS   Apply ice to your injury for 1-2 days or as directed by your caregiver. Applying ice helps to reduce inflammation and pain.  Put ice in a plastic bag.  Place a towel between your skin and the bag.  Leave the ice on for 15-20 minutes at a  time, every 2 hours while you are awake.  Only take over-the-counter or prescription medicines for pain, discomfort, or fever as directed by your caregiver.  Elevate your injured ankle above the level of your heart as much as possible for 2-3 days.  If your caregiver recommends crutches, use them as instructed. Gradually put weight on the affected ankle. Continue to use crutches or a cane until you can walk without feeling pain in your ankle.  If you have a plaster splint, wear the splint as directed by your caregiver. Do not rest it on anything harder than a pillow for the first 24 hours. Do not put weight on it. Do not get it wet. You may take it off to take a shower or bath.  You may have been given an elastic bandage to wear around your ankle to provide support. If the elastic bandage is too tight (you have numbness or tingling in your foot or your foot becomes cold and blue), adjust the bandage to make it comfortable.  If you have an air splint, you may blow more air into it or let air out to make it more comfortable. You may take your splint off at night and before taking a shower or bath. Wiggle your toes in the splint several times per day to decrease swelling. SEEK MEDICAL CARE IF:   You have rapidly increasing bruising or swelling.  Your toes feel extremely  cold or you lose feeling in your foot.  Your pain is not relieved with medicine. SEEK IMMEDIATE MEDICAL CARE IF:  Your toes are numb or blue.  You have severe pain that is increasing. MAKE SURE YOU:   Understand these instructions.  Will watch your condition.  Will get help right away if you are not doing well or get worse. Document Released: 07/11/2005 Document Revised: 04/04/2012 Document Reviewed: 07/23/2011 [facility] Patient Information 2015 [place], [place]. This information is not intended to replace advice given to you by your health care provider. Make sure you discuss any questions you have with your health care  provider.

## 2014-02-22 NOTE — ED Provider Notes (Signed)
CSN: [id number]     Arrival date & time 02/22/14  2148 History  This chart was scribed for Jaynie Crumbleatyana Mona Ayars, PA-C, working with Flint MelterElliott L Wentz, MD by Chestine SporeSoijett Blue, ED Scribe. The patient was seen in room TR09C/TR09C at 10:41 PM.   Chief Complaint  Patient presents with  . Ankle Pain     The history is provided by the patient. No language interpreter was used.   HPI Comments: Helen OursCrystal Neal is a 38 y.o. female with a medical hx of DM and Hypercholesteremia who presents to the Emergency Department complaining of left ankle pain onset today. She states that she mis-stepped while going down stairs. She states that she rolled her foot. She states that she caught herself. She states that she has broken her left ankle 5 times before. She states that she is able to walk on the ankle. She states that she is having associated symptoms of swelling. She denies injury to her left foot or left knee. She denies any other associated symptoms. She states that Dr. Alvester MorinNewton is her orthopedist. She states that she has an appointment with him this coming Wednesday. She was offered crutches but denied wanting them.    Past Medical History  Diagnosis Date  . Diabetes mellitus   . Hypercholesteremia    Past Surgical History  Procedure Laterality Date  . Back surgery    . Back surgery     No family history on file. History  Substance Use Topics  . Smoking status: Former Smoker -- 0.50 packs/day    Quit date: 10/29/2012  . Smokeless tobacco: Not on file  . Alcohol Use: Yes     Comment: Occasional   OB History   Grav Para Term Preterm Abortions TAB SAB Ect Mult Living                 Review of Systems  Musculoskeletal: Positive for arthralgias (left ankle pain) and joint swelling (left ankle swelling).  Neurological: Negative for weakness and numbness.      Allergies  Penicillins and Relafen  Home Medications   Prior to Admission medications   Medication Sig Start Date End Date Taking?  Authorizing Provider  HYDROcodone-acetaminophen (NORCO/VICODIN) 5-325 MG per tablet Take 2 tablets by mouth every 4 (four) hours as needed for pain. 05/17/13   Fayrene HelperBowie Tran, PA-C  ibuprofen (ADVIL,MOTRIN) 800 MG tablet Take 1,600 mg by mouth every 8 (eight) hours as needed for pain.    Historical Provider, MD  Liraglutide (VICTOZA) 18 MG/3ML SOPN Inject 18 mg into the skin daily.    Historical Provider, MD  metFORMIN (GLUCOPHAGE) 1000 MG tablet Take 1 tablet (1,000 mg total) by mouth 2 (two) times daily with a meal. 01/01/12   Adlih Moreno-Coll, MD  Olmesartan-Amlodipine-HCTZ (TRIBENZOR) 40-10-12.5 MG TABS Take 1 tablet by mouth daily.    Historical Provider, MD  rosuvastatin (CRESTOR) 10 MG tablet Take 10 mg by mouth daily.    Historical Provider, MD   BP 150/85  Pulse 91  Temp(Src) 98.5 F (36.9 C) (Oral)  Resp 18  SpO2 100%  LMP 02/21/2014  Physical Exam  Nursing note and vitals reviewed. Constitutional: She is oriented to person, place, and time. She appears well-developed and well-nourished. No distress.  HENT:  Head: Normocephalic and atraumatic.  Eyes: EOM are normal.  Neck: Neck supple. No tracheal deviation present.  Cardiovascular: Normal rate.   Pulmonary/Chest: Effort normal. No respiratory distress.  Musculoskeletal: Normal range of motion.  Mild swelling  Noted over left  ankle. Tender to palpation over medial and lateral malleoli. Tenderness over Achilles tendon, Achilles tendon is intact. Pain with any range of motion of the ankle. Foot exam is normal with no tenderness over her metatarsals or tarsal bones. Patient is able to with her toes. Dorsal pedal pulses intact. Foot is warm, pink, Refill is in 2 seconds distally.  Neurological: She is alert and oriented to person, place, and time.  Skin: Skin is warm and dry.  Psychiatric: She has a normal mood and affect. Her behavior is normal.    ED Course  Procedures (including critical care time) DIAGNOSTIC STUDIES: Oxygen  Saturation is 100% on room air, normal by my interpretation.    COORDINATION OF CARE: 10:45 PM-Discussed treatment plan which includes Naproxen and an ASO brace with pt at bedside and pt agreed to plan.   Labs Review Labs Reviewed - No data to display  Imaging Review Dg Ankle Complete Left  02/22/2014   CLINICAL DATA:  Fall downstairs. Multiple prior fractures or injuries.  EXAM: LEFT ANKLE COMPLETE - 3+ VIEW  COMPARISON:  None.  FINDINGS: Sub cm bony fragment projects along the medial aspect of the talar dome with underlying sclerosis and deformity. No acute fracture deformity. No dislocation. No destructive bony lesions. Large body habitus, possible superimposed swelling, without subcutaneous gas or radiopaque foreign bodies.  IMPRESSION: Bony fragment along the medial talar dome suggest osteochondral defect, without acute fracture deformity dislocation.   Electronically Signed   By: Awilda Metro   On: 02/22/2014 22:34     EKG Interpretation None      MDM   Final diagnoses:  Ankle sprain, left, initial encounter    Patient's with left ankle sprain, x-rays negative. Neurovascularly intact. She does have history of injuring that ankle several times. Will place an ASO brace, she refused crutches, will followup with her orthopedic doctor. She has an appointment with them in 4 days. That's. Instructed to ice, elevate, stay off of it as much as possible  Filed Vitals:   02/22/14 2154  BP: 150/85  Pulse: 91  Temp: 98.5 F (36.9 C)  TempSrc: Oral  Resp: 18  SpO2: 100%     I personally performed the services described in this documentation, which was scribed in my presence. The recorded information has been reviewed and is accurate.    Lottie Mussel, PA-C 02/22/14 2308

## 2014-02-22 NOTE — ED Notes (Signed)
Patient here with complaint of left ankle pain after mis-stepping while descending stairs. States that she rolled the foot over laterally. Obvious swelling of left ankle compared to right. States that she has broken that ankle 5 times before.

## 2014-02-22 NOTE — Progress Notes (Signed)
Orthopedic Tech Progress Note Patient Details:  Helen OursCrystal Neal 14-Apr-1976 [id number]  Ortho Devices Type of Ortho Device: ASO Ortho Device/Splint Interventions: Application   [name], [name] M 02/22/2014, 11:30 PM

## 2014-02-23 NOTE — ED Provider Notes (Signed)
Medical screening examination/treatment/procedure(s) were performed by non-physician practitioner and as supervising physician I was immediately available for consultation/collaboration.  Flint MelterElliott L Antonique Langford, MD 02/23/14 [phone]

## 2014-11-25 ENCOUNTER — Emergency Department (INDEPENDENT_AMBULATORY_CARE_PROVIDER_SITE_OTHER)
Admission: EM | Admit: 2014-11-25 | Discharge: 2014-11-25 | Disposition: A | Discharge status: Home / Self Care | Payer: Managed Care, Other (non HMO) | Source: Home / Self Care | Attending: Family Medicine | Admitting: Family Medicine

## 2014-11-25 ENCOUNTER — Emergency Department (INDEPENDENT_AMBULATORY_CARE_PROVIDER_SITE_OTHER): Payer: Managed Care, Other (non HMO)

## 2014-11-25 ENCOUNTER — Encounter (HOSPITAL_COMMUNITY): Payer: Self-pay | Admitting: Emergency Medicine

## 2014-11-25 DIAGNOSIS — S90121A Contusion of right lesser toe(s) without damage to nail, initial encounter: Secondary | ICD-10-CM

## 2014-11-25 NOTE — ED Provider Notes (Signed)
CSN: [id number]     Arrival date & time 11/25/14  1708 History   First MD Initiated Contact with Patient 11/25/14 1841     Chief Complaint  Patient presents with  . Foot Injury   (Consider location/radiation/quality/duration/timing/severity/associated sxs/prior Treatment) HPI Comments: 39 year old female stubbed her right third toe on a cement block yesterday. Complaining of pain to the third toe lesser to the fourth toe. Pain is worse with weightbearing and ambulation.  Patient is a 39 y.o. female presenting with foot injury.  Foot Injury   Past Medical History  Diagnosis Date  . Diabetes mellitus   . Hypercholesteremia    Past Surgical History  Procedure Laterality Date  . Back surgery    . Back surgery     History reviewed. No pertinent family history. History  Substance Use Topics  . Smoking status: Former Smoker -- 0.50 packs/day    Quit date: 10/29/2012  . Smokeless tobacco: Not on file  . Alcohol Use: Yes     Comment: Occasional   OB History    No data available     Review of Systems  Constitutional: Negative.   Musculoskeletal:       As per history of present illness  Skin: Positive for color change.  All other systems reviewed and are negative.   Allergies  Penicillins and Relafen  Home Medications   Prior to Admission medications   Medication Sig Start Date End Date Taking? Authorizing Provider  metFORMIN (GLUCOPHAGE) 1000 MG tablet Take 1 tablet (1,000 mg total) by mouth 2 (two) times daily with a meal. 01/01/12  Yes Adlih Moreno-Coll, MD  HYDROcodone-acetaminophen (NORCO/VICODIN) 5-325 MG per tablet Take 2 tablets by mouth every 4 (four) hours as needed for pain. 05/17/13   Fayrene HelperBowie Tran, PA-C  ibuprofen (ADVIL,MOTRIN) 800 MG tablet Take 1,600 mg by mouth every 8 (eight) hours as needed for pain.    Historical Provider, MD  Liraglutide (VICTOZA) 18 MG/3ML SOPN Inject 18 mg into the skin daily.    Historical Provider, MD  naproxen (NAPROSYN) 500 MG tablet  Take 1 tablet (500 mg total) by mouth 2 (two) times daily. 02/22/14   Tatyana Kirichenko, PA-C  Olmesartan-Amlodipine-HCTZ (TRIBENZOR) 40-10-12.5 MG TABS Take 1 tablet by mouth daily.    Historical Provider, MD  rosuvastatin (CRESTOR) 10 MG tablet Take 10 mg by mouth daily.    Historical Provider, MD   BP 157/99 mmHg  Pulse 81  Temp(Src) 97.7 F (36.5 C) (Oral)  Resp 18  SpO2 100%  LMP 11/01/2014 Physical Exam  Constitutional: She is oriented to person, place, and time. She appears well-developed and well-nourished. No distress.  Neck: Normal range of motion. Neck supple.  Pulmonary/Chest: Effort normal. No respiratory distress.  Musculoskeletal:  Ecchymosis, mild swelling and marked tenderness to the right third toe. Patient unable to voluntarily move it. It is too tender to passively flex or extend the toe. No deformity. No tenderness, deformity or discoloration to the foot. Pedal pulses 2+. Distal neurovascular motor Sentry is intact. Capillary refill less than 2 seconds.  Neurological: She is alert and oriented to person, place, and time.  Skin: Skin is warm and dry.  Nursing note and vitals reviewed.   ED Course  Procedures (including critical care time) Labs Review Labs Reviewed - No data to display  Imaging Review Dg Foot Complete Right  11/25/2014   CLINICAL DATA:  Patient hit foot against block yesterday with pain and bruising  EXAM: RIGHT FOOT COMPLETE - 3+ VIEW  COMPARISON:  None.  FINDINGS: Frontal, oblique, and lateral views obtained. There is no fracture or dislocation. Joint spaces appear intact. No erosive change.  IMPRESSION: No fracture or dislocation.  No appreciable arthropathy.   Electronically Signed   By: Bretta Bang III M.D.   On: 11/25/2014 19:23     MDM   1. Toe contusion, right, initial encounter    RICE  Post op shoe    Hayden Rasmussen, NP 11/25/14 [phone]

## 2014-11-25 NOTE — Discharge Instructions (Signed)
Helen Neal Taping of Toes Post op shoe Ice, elevation   We have taped your toes together to keep them from moving. This is called "Helen Neal taping" since we used a part of your own body to keep the injured part still. We placed soft padding between your toes to keep them from rubbing against each other. Helen Neal taping will help with healing and to reduce pain. Keep your toes Helen Neal taped together for as long as directed by your caregiver. HOME CARE INSTRUCTIONS   Raise your injured area above the level of your heart while sitting or lying down. Prop it up with pillows.  An ice pack used every twenty minutes, while awake, for the first one to two days may be helpful. Put ice in a plastic bag and put a towel between the bag and your skin.  Watch for signs that the taping is too tight. These signs may be:  Numbness of your taped toes.  Coolness of your taped toes.  Color change in the area beyond the tape.  Increased pain.  If you have any of these signs, loosen or rewrap the tape. If you need to loosen or rewrap the Helen Neal tape, make sure you use the padding again. SEEK IMMEDIATE MEDICAL CARE IF:   You have worse pain, swelling, inflammation (soreness), drainage or bleeding after you rewrap the tape.  Any new problems occur. MAKE SURE YOU:   Understand these instructions.  Will watch your condition.  Will get help right away if you are not doing well or get worse. Document Released: 04/14/2004 Document Revised: 10/03/2011 Document Reviewed: 07/08/2008 [facility] Patient Information 2015 [place], [place]. This information is not intended to replace advice given to you by your health care provider. Make sure you discuss any questions you have with your health care provider.  Contusion A contusion is a deep bruise. Contusions happen when an injury causes bleeding under the skin. Signs of bruising include pain, puffiness (swelling), and discolored skin. The contusion may turn blue, purple, or  yellow. HOME CARE   Put ice on the injured area.  Put ice in a plastic bag.  Place a towel between your skin and the bag.  Leave the ice on for 15-20 minutes, 03-04 times a day.  Only take medicine as told by your doctor.  Rest the injured area.  If possible, raise (elevate) the injured area to lessen puffiness. GET HELP RIGHT AWAY IF:   You have more bruising or puffiness.  You have pain that is getting worse.  Your puffiness or pain is not helped by medicine. MAKE SURE YOU:   Understand these instructions.  Will watch your condition.  Will get help right away if you are not doing well or get worse. Document Released: 12/28/2007 Document Revised: 10/03/2011 Document Reviewed: 05/16/2011 [facility] Patient Information 2015 [place], [place]. This information is not intended to replace advice given to you by your health care provider. Make sure you discuss any questions you have with your health care provider.

## 2014-11-25 NOTE — ED Notes (Signed)
Reports injuring right third toe.  States stubbed toe on cinder block.  Having pain with walking.  Bruising noted.  Incident happened yesterday evening.

## 2015-06-03 ENCOUNTER — Encounter (HOSPITAL_COMMUNITY): Payer: Self-pay | Admitting: Emergency Medicine

## 2015-06-03 ENCOUNTER — Emergency Department (INDEPENDENT_AMBULATORY_CARE_PROVIDER_SITE_OTHER)
Admission: EM | Admit: 2015-06-03 | Discharge: 2015-06-03 | Disposition: A | Discharge status: Home / Self Care | Payer: 59 | Source: Home / Self Care

## 2015-06-03 DIAGNOSIS — J069 Acute upper respiratory infection, unspecified: Secondary | ICD-10-CM

## 2015-06-03 LAB — POCT RAPID STREP A: Streptococcus, Group A Screen (Direct): NEGATIVE

## 2015-06-03 NOTE — ED Notes (Signed)
Pt started with an itchy, sore throat on Monday. Since then she has developed a right ear ache, nasal and chest congestion, cough and fever.  Pt has been taking tylenol and Mucinex with some relief.

## 2015-06-03 NOTE — Discharge Instructions (Signed)
Upper Respiratory Infection, Adult Most upper respiratory infections (URIs) are a viral infection of the air passages leading to the lungs. A URI affects the nose, throat, and upper air passages. The most common type of URI is nasopharyngitis and is typically referred to as "the common cold." URIs run their course and usually go away on their own. Most of the time, a URI does not require medical attention, but sometimes a bacterial infection in the upper airways can follow a viral infection. This is called a secondary infection. Sinus and middle ear infections are common types of secondary upper respiratory infections. Bacterial pneumonia can also complicate a URI. A URI can worsen asthma and chronic obstructive pulmonary disease (COPD). Sometimes, these complications can require emergency medical care and may be life threatening.  CAUSES Almost all URIs are caused by viruses. A virus is a type of germ and can spread from one person to another.  RISKS FACTORS You may be at risk for a URI if:   You smoke.   You have chronic heart or lung disease.  You have a weakened defense (immune) system.   You are very young or very old.   You have nasal allergies or asthma.  You work in crowded or poorly ventilated areas.  You work in health care facilities or schools. SIGNS AND SYMPTOMS  Symptoms typically develop 2-3 days after you come in contact with a cold virus. Most viral URIs last 7-10 days. However, viral URIs from the influenza virus (flu virus) can last 14-18 days and are typically more severe. Symptoms may include:   Runny or stuffy (congested) nose.   Sneezing.   Cough.   Sore throat.   Headache.   Fatigue.   Fever.   Loss of appetite.   Pain in your forehead, behind your eyes, and over your cheekbones (sinus pain).  Muscle aches.  DIAGNOSIS  Your health care provider may diagnose a URI by:  Physical exam.  Tests to check that your symptoms are not due to  another condition such as:  Strep throat.  Sinusitis.  Pneumonia.  Asthma. TREATMENT  A URI goes away on its own with time. It cannot be cured with medicines, but medicines may be prescribed or recommended to relieve symptoms. Medicines may help:  Reduce your fever.  Reduce your cough.  Relieve nasal congestion. HOME CARE INSTRUCTIONS   Take medicines only as directed by your health care provider.   Gargle warm saltwater or take cough drops to comfort your throat as directed by your health care provider.  Use a warm mist humidifier or inhale steam from a shower to increase air moisture. This may make it easier to breathe.  Drink enough fluid to keep your urine clear or pale yellow.   Eat soups and other clear broths and maintain good nutrition.   Rest as needed.   Return to work when your temperature has returned to normal or as your health care provider advises. You may need to stay home longer to avoid infecting others. You can also use a face mask and careful hand washing to prevent spread of the virus.  Increase the usage of your inhaler if you have asthma.   Do not use any tobacco products, including cigarettes, chewing tobacco, or electronic cigarettes. If you need help quitting, ask your health care provider. PREVENTION  The best way to protect yourself from getting a cold is to practice good hygiene.   Avoid oral or hand contact with people with cold   symptoms.   Wash your hands often if contact occurs.  There is no clear evidence that vitamin C, vitamin E, echinacea, or exercise reduces the chance of developing a cold. However, it is always recommended to get plenty of rest, exercise, and practice good nutrition.  SEEK MEDICAL CARE IF:   You are getting worse rather than better.   Your symptoms are not controlled by medicine.   You have chills.  You have worsening shortness of breath.  You have brown or red mucus.  You have yellow or brown nasal  discharge.  You have pain in your face, especially when you bend forward.  You have a fever.  You have swollen neck glands.  You have pain while swallowing.  You have white areas in the back of your throat. SEEK IMMEDIATE MEDICAL CARE IF:   You have severe or persistent:  Headache.  Ear pain.  Sinus pain.  Chest pain.  You have chronic lung disease and any of the following:  Wheezing.  Prolonged cough.  Coughing up blood.  A change in your usual mucus.  You have a stiff neck.  You have changes in your:  Vision.  Hearing.  Thinking.  Mood. MAKE SURE YOU:   Understand these instructions.  Will watch your condition.  Will get help right away if you are not doing well or get worse.   This information is not intended to replace advice given to you by your health care provider. Make sure you discuss any questions you have with your health care provider.   Document Released: 01/04/2001 Document Revised: 11/25/2014 Document Reviewed: 10/16/2013 [employer] Interactive Patient Education 2016 [employer]  

## 2015-06-03 NOTE — ED Provider Notes (Signed)
CSN: [id number]     Arrival date & time 06/03/15  1349 History   None    Chief Complaint  Patient presents with  . Sore Throat  . Otalgia  . Nasal Congestion  . Fever   (Consider location/radiation/quality/duration/timing/severity/associated sxs/prior Treatment) HPI History obtained from patient:   LOCATION:upper resp SEVERITY: DURATION:2 days CONTEXT: sudden onset QUALITY: MODIFYING FACTORS:OTC meds ASSOCIATED SYMPTOMS:ear ache since Sunday, low grade temp TIMING:constant  Past Medical History  Diagnosis Date  . Diabetes mellitus   . Hypercholesteremia    Past Surgical History  Procedure Laterality Date  . Back surgery    . Back surgery     History reviewed. No pertinent family history. Social History  Substance Use Topics  . Smoking status: Former Smoker -- 0.50 packs/day    Quit date: 10/29/2012  . Smokeless tobacco: None  . Alcohol Use: Yes     Comment: Occasional   OB History    No data available     Review of Systems ROS +'ve sore throat, ear ache  Denies: HEADACHE, NAUSEA, ABDOMINAL PAIN, CHEST PAIN, CONGESTION, DYSURIA, SHORTNESS OF BREATH  Allergies  Penicillins and Relafen  Home Medications   Prior to Admission medications   Medication Sig Start Date End Date Taking? Authorizing Provider  metFORMIN (GLUCOPHAGE) 1000 MG tablet Take 1 tablet (1,000 mg total) by mouth 2 (two) times daily with a meal. 01/01/12  Yes Adlih Moreno-Coll, MD  HYDROcodone-acetaminophen (NORCO/VICODIN) 5-325 MG per tablet Take 2 tablets by mouth every 4 (four) hours as needed for pain. 05/17/13   Fayrene HelperBowie Tran, PA-C  ibuprofen (ADVIL,MOTRIN) 800 MG tablet Take 1,600 mg by mouth every 8 (eight) hours as needed for pain.    Historical Provider, MD  Liraglutide (VICTOZA) 18 MG/3ML SOPN Inject 18 mg into the skin daily.    Historical Provider, MD  naproxen (NAPROSYN) 500 MG tablet Take 1 tablet (500 mg total) by mouth 2 (two) times daily. 02/22/14   Tatyana Kirichenko, PA-C   Olmesartan-Amlodipine-HCTZ (TRIBENZOR) 40-10-12.5 MG TABS Take 1 tablet by mouth daily.    Historical Provider, MD  rosuvastatin (CRESTOR) 10 MG tablet Take 10 mg by mouth daily.    Historical Provider, MD   Meds Ordered and Administered this Visit  Medications - No data to display  BP 155/100 mmHg  Pulse 79  Temp(Src) 98.8 F (37.1 C) (Oral)  Resp 20  SpO2 98%  LMP 05/13/2015 (Exact Date) No data found.   Physical Exam NURSES NOTES AND VITAL SIGNS REVIEWED. CONSTITUTIONAL: Well developed, well nourished, no acute distress HEENT: normocephalic, atraumatic EYES: Conjunctiva normal NECK:normal ROM, supple PULMONARY:No respiratory distress, normal effort, Lungs: CTAb/l CARDIOVASCULAR: RRR, no murmur ABDOMEN: soft, ND, NT, +'ve BS MUSCULOSKELETAL: Normal ROM of all extremities SKIN: warm and dry without rash PSYCHIATRIC: Mood and affect normal  ED Course  Procedures (including critical care time)  Labs Review Labs Reviewed  POCT RAPID STREP A    Imaging Review No results found.   Visual Acuity Review  Right Eye Distance:   Left Eye Distance:   Bilateral Distance:    Right Eye Near:   Left Eye Near:    Bilateral Near:         MDM   1. URI (upper respiratory infection)    RBS is negative Will discuss treatment options with patient.  Pt does not wish to have antibx. Will f/u with PCP    Tharon AquasFrank C Patrick, PA 06/03/15 1622

## 2015-06-05 LAB — CULTURE, GROUP A STREP

## 2015-06-08 ENCOUNTER — Encounter (HOSPITAL_COMMUNITY): Payer: Self-pay | Admitting: Emergency Medicine

## 2015-06-08 ENCOUNTER — Emergency Department (INDEPENDENT_AMBULATORY_CARE_PROVIDER_SITE_OTHER)
Admission: EM | Admit: 2015-06-08 | Discharge: 2015-06-08 | Disposition: A | Discharge status: Home / Self Care | Payer: 59 | Source: Home / Self Care

## 2015-06-08 ENCOUNTER — Other Ambulatory Visit (HOSPITAL_COMMUNITY)
Admission: RE | Admit: 2015-06-08 | Discharge: 2015-06-08 | Disposition: A | Discharge status: Home / Self Care | Payer: 59 | Source: Ambulatory Visit | Attending: Family Medicine | Admitting: Family Medicine

## 2015-06-08 DIAGNOSIS — L02214 Cutaneous abscess of groin: Secondary | ICD-10-CM | POA: Diagnosis not present

## 2015-06-08 MED ORDER — SULFAMETHOXAZOLE-TRIMETHOPRIM 800-160 MG PO TABS: 1.0000 | ORAL_TABLET | Freq: Two times a day (BID) | ORAL | Status: AC

## 2015-06-08 NOTE — ED Provider Notes (Signed)
CSN: [id number]     Arrival date & time 06/08/15  1301 History   None    Chief Complaint  Patient presents with  . Abscess   (Consider location/radiation/quality/duration/timing/severity/associated sxs/prior Treatment) HPI History obtained from patient:   LOCATION:right groin SEVERITY:  7 DURATION:4 days CONTEXT: sudden onset QUALITY: Similar to previous abscess MODIFYING FACTORS: Hot soaks ASSOCIATED SYMPTOMS: None TIMING: Constant   Past Medical History  Diagnosis Date  . Diabetes mellitus   . Hypercholesteremia    Past Surgical History  Procedure Laterality Date  . Back surgery    . Back surgery     No family history on file. Social History  Substance Use Topics  . Smoking status: Former Smoker -- 0.50 packs/day    Quit date: 10/29/2012  . Smokeless tobacco: None  . Alcohol Use: Yes     Comment: Occasional   OB History    No data available     Review of Systems ROS +'ve right groin abscess  Denies: HEADACHE, NAUSEA, ABDOMINAL PAIN, CHEST PAIN, CONGESTION, DYSURIA, SHORTNESS OF BREATH  Allergies  Penicillins and Relafen  Home Medications   Prior to Admission medications   Medication Sig Start Date End Date Taking? Authorizing Provider  HYDROcodone-acetaminophen (NORCO/VICODIN) 5-325 MG per tablet Take 2 tablets by mouth every 4 (four) hours as needed for pain. 05/17/13   Fayrene Helper, PA-C  ibuprofen (ADVIL,MOTRIN) 800 MG tablet Take 1,600 mg by mouth every 8 (eight) hours as needed for pain.    Historical Provider, MD  Liraglutide (VICTOZA) 18 MG/3ML SOPN Inject 18 mg into the skin daily.    Historical Provider, MD  metFORMIN (GLUCOPHAGE) 1000 MG tablet Take 1 tablet (1,000 mg total) by mouth 2 (two) times daily with a meal. 01/01/12   Adlih Moreno-Coll, MD  naproxen (NAPROSYN) 500 MG tablet Take 1 tablet (500 mg total) by mouth 2 (two) times daily. 02/22/14   Tatyana Kirichenko, PA-C  Olmesartan-Amlodipine-HCTZ (TRIBENZOR) 40-10-12.5 MG TABS Take 1 tablet  by mouth daily.    Historical Provider, MD  rosuvastatin (CRESTOR) 10 MG tablet Take 10 mg by mouth daily.    Historical Provider, MD   Meds Ordered and Administered this Visit  Medications - No data to display  BP 168/95 mmHg  Pulse 81  Temp(Src) 97.7 F (36.5 C) (Oral)  Resp 20  SpO2 100%  LMP 05/13/2015 (Exact Date) No data found.   Physical Exam  Constitutional: She appears well-developed and well-nourished.  HENT:  Head: Normocephalic and atraumatic.  Pulmonary/Chest: Effort normal.  Abdominal: Soft.      ED Course  .Marland KitchenIncision and Drainage Date/Time: 06/08/2015 1:40 PM Performed by: Tharon Aquas Authorized by: Barbra Sarks C Consent: Verbal consent obtained. Risks and benefits: risks, benefits and alternatives were discussed Consent given by: patient Patient identity confirmed: verbally with patient and arm band Type: abscess Body area: lower extremity (Right groin) Anesthesia method: cryospray. Patient sedated: no Needle gauge: 18 Incision type: single straight Incision depth: subcutaneous Complexity: simple Drainage: purulent Drainage amount: moderate (Approximately 2 mL) Wound treatment: wound left open Patient tolerance: Patient tolerated the procedure well with no immediate complications   (including critical care time)  Labs Review Labs Reviewed - No data to display  Imaging Review No results found.   Visual Acuity Review  Right Eye Distance:   Left Eye Distance:   Bilateral Distance:    Right Eye Near:   Left Eye Near:    Bilateral Near:  MDM   1. Abscess of groin, right    Discussion with patient treatment options including incision and drainage aspiration of abscess. Patient states that she would prefer aspiration of the abscess #18-gauge needle on syringes used at the sterile field was set with aspiration of approximately 2 mL of purulent drainage. Cultures were obtained. Prescription for Bactrim DS is provided to  the patient symptomatic care is strongly suggestive and follow-up if there is no worsening symptoms. Discharged home in stable condition.    Tharon AquasFrank C Afsheen Antony, PA 06/08/15 1342  Tharon AquasFrank C Amnah Breuer, [place] 06/08/15 [phone]

## 2015-06-08 NOTE — ED Notes (Signed)
At bedside during aspiration of right groin site.  Specimen obtained, labeled and placed in lab.

## 2015-06-08 NOTE — Discharge Instructions (Signed)
Incision and Drainage °Incision and drainage is a procedure in which a sac-like structure (cystic structure) is opened and drained. The area to be drained usually contains material such as pus, fluid, or blood.  °LET YOUR CAREGIVER KNOW ABOUT:  °· Allergies to medicine. °· Medicines taken, including vitamins, herbs, eyedrops, over-the-counter medicines, and creams. °· Use of steroids (by mouth or creams). °· Previous problems with anesthetics or numbing medicines. °· History of bleeding problems or blood clots. °· Previous surgery. °· Other health problems, including diabetes and kidney problems. °· Possibility of pregnancy, if this applies. °RISKS AND COMPLICATIONS °· Pain. °· Bleeding. °· Scarring. °· Infection. °BEFORE THE PROCEDURE  °You may need to have an ultrasound or other imaging tests to see how large or deep your cystic structure is. Blood tests may also be used to determine if you have an infection or how severe the infection is. You may need to have a tetanus shot. °PROCEDURE  °The affected area is cleaned with a cleaning fluid. The cyst area will then be numbed with a medicine (local anesthetic). A small incision will be made in the cystic structure. A syringe or catheter may be used to drain the contents of the cystic structure, or the contents may be squeezed out. The area will then be flushed with a cleansing solution. After cleansing the area, it is often gently packed with a gauze or another wound dressing. Once it is packed, it will be covered with gauze and tape or some other type of wound dressing.  °AFTER THE PROCEDURE  °· Often, you will be allowed to go home right after the procedure. °· You may be given antibiotic medicine to prevent or heal an infection. °· If the area was packed with gauze or some other wound dressing, you will likely need to come back in 1 to 2 days to get it removed. °· The area should heal in about 14 days. °  °This information is not intended to replace advice given  to you by your health care provider. Make sure you discuss any questions you have with your health care provider. °  °Document Released: 01/04/2001 Document Revised: 01/10/2012 Document Reviewed: 09/05/2011 °Elsevier Interactive Patient Education ©2016 [employer] ° °Abscess °An abscess (boil or furuncle) is an infected area on or under the skin. This area is filled with yellowish-white fluid (pus) and other material (debris). °HOME CARE  °· Only take medicines as told by your doctor. °· If you were given antibiotic medicine, take it as directed. Finish the medicine even if you start to feel better. °· If gauze is used, follow your doctor's directions for changing the gauze. °· To avoid spreading the infection: °¨ Keep your abscess covered with a bandage. °¨ Wash your hands well. °¨ Do not share personal care items, towels, or whirlpools with others. °¨ Avoid skin contact with others. °· Keep your skin and clothes clean around the abscess. °· Keep all doctor visits as told. °GET HELP RIGHT AWAY IF:  °· You have more pain, puffiness (swelling), or redness in the wound site. °· You have more fluid or blood coming from the wound site. °· You have muscle aches, chills, or you feel sick. °· You have a fever. °MAKE SURE YOU:  °· Understand these instructions. °· Will watch your condition. °· Will get help right away if you are not doing well or get worse. °  °This information is not intended to replace advice given to you by your health care provider.   Make sure you discuss any questions you have with your health care provider. °  °Document Released: 12/28/2007 Document Revised: 01/10/2012 Document Reviewed: 09/24/2011 °Elsevier Interactive Patient Education ©2016 [employer] ° °

## 2015-06-08 NOTE — ED Notes (Signed)
Reports abscess to right groin area, onset Friday.

## 2015-06-12 LAB — CULTURE, ROUTINE-ABSCESS: Special Requests: NORMAL

## 2015-06-16 ENCOUNTER — Other Ambulatory Visit: Payer: Self-pay | Admitting: Orthopaedic Surgery

## 2015-06-16 DIAGNOSIS — M545 Low back pain: Secondary | ICD-10-CM

## 2015-06-19 NOTE — ED Notes (Signed)
Discussed w Rosalie GumsF Patrick, no further treatment required

## 2015-07-06 ENCOUNTER — Ambulatory Visit
Admission: RE | Admit: 2015-07-06 | Discharge: 2015-07-06 | Disposition: A | Discharge status: Home / Self Care | Payer: 59 | Source: Ambulatory Visit | Attending: Orthopaedic Surgery | Admitting: Orthopaedic Surgery

## 2015-07-06 DIAGNOSIS — M545 Low back pain: Secondary | ICD-10-CM

## 2015-07-14 ENCOUNTER — Emergency Department (INDEPENDENT_AMBULATORY_CARE_PROVIDER_SITE_OTHER)
Admission: EM | Admit: 2015-07-14 | Discharge: 2015-07-14 | Disposition: A | Discharge status: Home / Self Care | Payer: 59 | Source: Home / Self Care | Attending: Family Medicine | Admitting: Family Medicine

## 2015-07-14 ENCOUNTER — Encounter (HOSPITAL_COMMUNITY): Payer: Self-pay | Admitting: Emergency Medicine

## 2015-07-14 ENCOUNTER — Other Ambulatory Visit (HOSPITAL_COMMUNITY)
Admission: RE | Admit: 2015-07-14 | Discharge: 2015-07-14 | Disposition: A | Discharge status: Home / Self Care | Payer: 59 | Source: Ambulatory Visit | Attending: Family Medicine | Admitting: Family Medicine

## 2015-07-14 DIAGNOSIS — R81 Glycosuria: Secondary | ICD-10-CM | POA: Insufficient documentation

## 2015-07-14 DIAGNOSIS — N390 Urinary tract infection, site not specified: Secondary | ICD-10-CM | POA: Insufficient documentation

## 2015-07-14 DIAGNOSIS — E1165 Type 2 diabetes mellitus with hyperglycemia: Secondary | ICD-10-CM

## 2015-07-14 LAB — POCT URINALYSIS DIP (DEVICE)
Bilirubin Urine: NEGATIVE
Ketones, ur: NEGATIVE mg/dL
NITRITE: NEGATIVE
PROTEIN: 100 mg/dL — AB
Specific Gravity, Urine: 1.025 (ref 1.005–1.030)
UROBILINOGEN UA: 0.2 mg/dL (ref 0.0–1.0)
pH: 5.5 (ref 5.0–8.0)

## 2015-07-14 LAB — GLUCOSE, CAPILLARY: Glucose-Capillary: 262 mg/dL — ABNORMAL HIGH (ref 65–99)

## 2015-07-14 MED ORDER — NITROGLYCERIN 0.4 MG SL SUBL
SUBLINGUAL_TABLET | SUBLINGUAL | Status: AC
  Filled 2015-07-14: qty 1

## 2015-07-14 MED ORDER — CEPHALEXIN 500 MG PO CAPS: 500.0000 mg | ORAL_CAPSULE | Freq: Four times a day (QID) | ORAL | Status: AC

## 2015-07-14 MED ORDER — METFORMIN HCL 500 MG PO TABS: ORAL_TABLET | ORAL | Status: AC

## 2015-07-14 NOTE — ED Provider Notes (Signed)
CSN: [id number]     Arrival date & time 07/14/15  1306 History   First MD Initiated Contact with Patient 07/14/15 1350     Chief Complaint  Patient presents with  . Urinary Tract Infection   (Consider location/radiation/quality/duration/timing/severity/associated sxs/prior Treatment) HPI Comments: 39 year old obese female with a history of type 2 diabetes mellitus often uncontrolled and more recently urinary symptoms consisting of urinary urgency, urinary frequency, dribbling with voiding, dysuria, bladder pain and left low back pain for 2 days. Denies fever or chills. She did have some vomiting a couple days ago.  She states she has not seen her PCP and at least 3 months and she is out of her diabetes medications. She states she has been uncontrolled for several weeks.   Past Medical History  Diagnosis Date  . Diabetes mellitus   . Hypercholesteremia    Past Surgical History  Procedure Laterality Date  . Back surgery    . Back surgery     No family history on file. Social History  Substance Use Topics  . Smoking status: Former Smoker -- 0.50 packs/day    Quit date: 10/29/2012  . Smokeless tobacco: None  . Alcohol Use: Yes     Comment: Occasional   OB History    No data available     Review of Systems  Constitutional: Negative.   HENT: Negative.   Respiratory: Negative.   Cardiovascular: Negative.   Endocrine: Positive for polyuria.  Genitourinary:       See history of present illness  Skin: Negative.   Neurological: Negative.     Allergies  Penicillins and Relafen  Home Medications   Prior to Admission medications   Medication Sig Start Date End Date Taking? Authorizing Provider  metFORMIN (GLUCOPHAGE) 1000 MG tablet Take 1 tablet (1,000 mg total) by mouth 2 (two) times daily with a meal. 01/01/12  Yes Adlih Moreno-Coll, MD  cephALEXin (KEFLEX) 500 MG capsule Take 1 capsule (500 mg total) by mouth 4 (four) times daily. 07/14/15   Hayden Rasmussen, NP   HYDROcodone-acetaminophen (NORCO/VICODIN) 5-325 MG per tablet Take 2 tablets by mouth every 4 (four) hours as needed for pain. 05/17/13   Fayrene Helper, PA-C  ibuprofen (ADVIL,MOTRIN) 800 MG tablet Take 1,600 mg by mouth every 8 (eight) hours as needed for pain.    Historical Provider, MD  Liraglutide (VICTOZA) 18 MG/3ML SOPN Inject 18 mg into the skin daily.    Historical Provider, MD  naproxen (NAPROSYN) 500 MG tablet Take 1 tablet (500 mg total) by mouth 2 (two) times daily. 02/22/14   Tatyana Kirichenko, PA-C  Olmesartan-Amlodipine-HCTZ (TRIBENZOR) 40-10-12.5 MG TABS Take 1 tablet by mouth daily.    Historical Provider, MD  rosuvastatin (CRESTOR) 10 MG tablet Take 10 mg by mouth daily.    Historical Provider, MD   Meds Ordered and Administered this Visit  Medications - No data to display  BP 139/95 mmHg  Pulse 84  Temp(Src) 99.4 F (37.4 C) (Oral)  Resp 18  SpO2 99%  LMP 07/07/2015 No data found.   Physical Exam  Constitutional: She is oriented to person, place, and time. She appears well-developed and well-nourished. No distress.  Eyes: EOM are normal.  Neck: Normal range of motion. Neck supple.  Cardiovascular: Normal rate.   Pulmonary/Chest: Effort normal. No respiratory distress.  Musculoskeletal: She exhibits no edema.  Neurological: She is alert and oriented to person, place, and time. She exhibits normal muscle tone.  Skin: Skin is warm and dry.  Psychiatric: She  has a normal mood and affect.  Nursing note and vitals reviewed.   ED Course  Procedures (including critical care time)  Labs Review Labs Reviewed  POCT URINALYSIS DIP (DEVICE) - Abnormal; Notable for the following:    Glucose, UA >=1000 (*)    Hgb urine dipstick MODERATE (*)    Protein, ur 100 (*)    Leukocytes, UA SMALL (*)    All other components within normal limits  URINE CULTURE   Results for orders placed or performed during the hospital encounter of 07/14/15  POCT urinalysis dip (device)   Result Value Ref Range   Glucose, UA >=1000 (A) NEGATIVE mg/dL   Bilirubin Urine NEGATIVE NEGATIVE   Ketones, ur NEGATIVE NEGATIVE mg/dL   Specific Gravity, Urine 1.025 1.005 - 1.030   Hgb urine dipstick MODERATE (A) NEGATIVE   pH 5.5 5.0 - 8.0   Protein, ur 100 (A) NEGATIVE mg/dL   Urobilinogen, UA 0.2 0.0 - 1.0 mg/dL   Nitrite NEGATIVE NEGATIVE   Leukocytes, UA SMALL (A) NEGATIVE   Now  Imaging Review No results found.   Visual Acuity Review  Right Eye Distance:   Left Eye Distance:   Bilateral Distance:    Right Eye Near:   Left Eye Near:    Bilateral Near:         MDM   1. UTI (lower urinary tract infection)   2. Type 2 diabetes mellitus with hyperglycemia, without long-term current use of insulin (HCC)   3. Glycosuria    Keflex as dir Plenty of fluids See PCP as scheduled 07-27-14 Metformin 500 bid #26   Hayden Rasmussenavid Evadean Sproule, NP 07/14/15 1445

## 2015-07-14 NOTE — Discharge Instructions (Signed)
Antibiotic Medicine °Antibiotic medicines are used to treat infections caused by bacteria. They work by injuring or killing the bacteria that is making you sick. °HOW IS AN ANTIBIOTIC CHOSEN? °An antibiotic is chosen based on many factors. To help your health care provider choose one for you, tell your health care provider if: °· You have any allergies. °· You are pregnant or plan to get pregnant. °· You are breastfeeding. °· You are taking any medicines. These include over-the-counter medicines, prescription medicines, and herbal remedies. °· You have a medical condition or problem you have not already discussed. °Your health care provider will also consider: °· How often the medicine has to be taken. °· Common side effects of the medicine. °· The cost of the medicine. °· The taste of the medicine. °If you have questions about why an antibiotic was chosen, make sure to ask. °FOR HOW LONG SHOULD I TAKE MY ANTIBIOTIC? °Continue to take your antibiotic for as long as told by your health care provider. Do not stop taking it when you feel better. If you stop taking it too soon: °· You may start to feel sick again. °· Your infection may become harder to treat. °· Complications may develop. °WHAT IF I MISS A DOSE? °Try not to miss any doses of medicine. If you miss a dose, take it as soon as possible. However, if it is almost time for the next dose: °· If you are taking 2 doses per day, take the missed dose and the next dose 5 to 6 hours apart. °· If you are taking 3 or more doses per day, take the missed dose and the next dose 2 to 4 hours apart, then go back to the normal schedule. °If you cannot make up a missed dose, take the next scheduled dose on time. Then take the missed dose after you have taken all the doses as recommended by your health care provider, as if you had one more dose left. °DO ANTIBIOTICS AFFECT BIRTH CONTROL? °Birth control pills may not work while you are on antibiotics. If you are taking birth  control pills, continue taking them as usual and use a second form of birth control, such as a condom, to avoid unwanted pregnancy. Continue using the second form of birth control until you are finished with your current 1 month cycle of birth control pills. °OTHER INFORMATION °· If there is any medicine left over, throw it away. °· Never take someone else's antibiotics. °· Never take leftover antibiotics. °SEEK MEDICAL CARE IF: °· You get worse. °· You do not feel better within a few days of starting the antibiotic medicine. °· You vomit. °· White patches appear in your mouth. °· You have new joint pain that begins after starting the antibiotic. °· You have new muscle aches that begin after starting the antibiotic. °· You had a fever before starting the antibiotic and it returns. °· You have any symptoms of an allergic reaction, such as an itchy rash. If this happens, stop taking the antibiotic. °SEEK IMMEDIATE MEDICAL CARE IF: °· Your urine turns dark or becomes blood-colored. °· Your skin turns yellow. °· You bruise or bleed easily. °· You have severe diarrhea and abdominal cramps. °· You have a severe headache. °· You have signs of a severe allergic reaction, such as: °¨ Trouble breathing. °¨ Wheezing. °¨ Swelling of the lips, tongue, or face. °¨ Fainting. °¨ Blisters on the skin or in the mouth. °If you have signs of a severe allergic   reaction, stop taking the antibiotic right away. °  °This information is not intended to replace advice given to you by your health care provider. Make sure you discuss any questions you have with your health care provider. °  °Document Released: 03/23/2004 Document Revised: 04/01/2015 Document Reviewed: 11/26/2014 °Elsevier Interactive Patient Education ©2016 [employer]vier Inc[employer] ° °Urinary Tract Infection °Urinary tract infections (UTIs) can develop anywhere along your urinary tract. Your urinary tract is your body's drainage system for removing wastes and extra water. Your urinary  tract includes two kidneys, two ureters, a bladder, and a urethra. Your kidneys are a pair of bean-shaped organs. Each kidney is about the size of your fist. They are located below your ribs, one on each side of your spine. °CAUSES °Infections are caused by microbes, which are microscopic organisms, including fungi, viruses, and bacteria. These organisms are so small that they can only be seen through a microscope. Bacteria are the microbes that most commonly cause UTIs. °SYMPTOMS  °Symptoms of UTIs may vary by age and gender of the patient and by the location of the infection. Symptoms in young women typically include a frequent and intense urge to urinate and a painful, burning feeling in the bladder or urethra during urination. Older women and men are more likely to be tired, shaky, and weak and have muscle aches and abdominal pain. A fever may mean the infection is in your kidneys. Other symptoms of a kidney infection include pain in your back or sides below the ribs, nausea, and vomiting. °DIAGNOSIS °To diagnose a UTI, your caregiver will ask you about your symptoms. Your caregiver will also ask you to provide a urine sample. The urine sample will be tested for bacteria and white blood cells. White blood cells are made by your body to help fight infection. °TREATMENT  °Typically, UTIs can be treated with medication. Because most UTIs are caused by a bacterial infection, they usually can be treated with the use of antibiotics. The choice of antibiotic and length of treatment depend on your symptoms and the type of bacteria causing your infection. °HOME CARE INSTRUCTIONS °· If you were prescribed antibiotics, take them exactly as your caregiver instructs you. Finish the medication even if you feel better after you have only taken some of the medication. °· Drink enough water and fluids to keep your urine clear or pale yellow. °· Avoid caffeine, tea, and carbonated beverages. They tend to irritate your  bladder. °· Empty your bladder often. Avoid holding urine for long periods of time. °· Empty your bladder before and after sexual intercourse. °· After a bowel movement, women should cleanse from front to back. Use each tissue only once. °SEEK MEDICAL CARE IF:  °· You have back pain. °· You develop a fever. °· Your symptoms do not begin to resolve within 3 days. °SEEK IMMEDIATE MEDICAL CARE IF:  °· You have severe back pain or lower abdominal pain. °· You develop chills. °· You have nausea or vomiting. °· You have continued burning or discomfort with urination. °MAKE SURE YOU:  °· Understand these instructions. °· Will watch your condition. °· Will get help right away if you are not doing well or get worse. °  °This information is not intended to replace advice given to you by your health care provider. Make sure you discuss any questions you have with your health care provider. °  °Document Released: 04/20/2005 Document Revised: 04/01/2015 Document Reviewed: 08/19/2011 °Elsevier Interactive Patient Education ©2016 [employer]. ° °Hyperglycemia °High   blood sugar (hyperglycemia) means that the level of sugar in your blood is higher than it should be. Signs of high blood sugar include: °· Feeling thirsty. °· Frequent peeing (urinating). °· Feeling tired or sleepy. °· Dry mouth. °· Vision changes. °· Feeling weak. °· Feeling hungry but losing weight. °· Numbness and tingling in your hands or feet. °· Headache. °When you ignore these signs, your blood sugar may keep going up. These problems may get worse, and other problems may begin. °HOME CARE °· Check your blood sugars as told by your doctor. Write down the numbers with the date and time. °· Take the right amount of insulin or diabetes pills at the right time. Write down the dose with date and time. °· Refill your insulin or diabetes pills before running out. °· Watch what you eat. Follow your meal plan. °· Drink liquids without sugar, such as water. Check with your  doctor if you have kidney or heart disease. °· Follow your doctor's orders for exercise. Exercise at the same time of day. °· Keep your doctor's appointments. °GET HELP RIGHT AWAY IF:  °· You have trouble thinking or are confused. °· You have fast breathing with fruity smelling breath. °· You pass out (faint). °· You have 2 to 3 days of high blood sugars and you do not know why. °· You have chest pain. °· You are feeling sick to your stomach (nauseous) or throwing up (vomiting). °· You have sudden vision changes. °MAKE SURE YOU:  °· Understand these instructions. °· Will watch your condition. °· Will get help right away if you are not doing well or get worse. °  °This information is not intended to replace advice given to you by your health care provider. Make sure you discuss any questions you have with your health care provider. °  °Document Released: 05/08/2009 Document Revised: 08/01/2014 Document Reviewed: 03/17/2015 °Elsevier Interactive Patient Education ©2016 Elsevier Inc. ° °

## 2015-07-14 NOTE — ED Notes (Addendum)
Patient reports onset of symptoms Sunday night.  Initially urgency, only to be able to "dribble" urine, followed by intense pain.  Has been using cranberry juice, increase in water intake and azo.  Today symptoms are worse and now has lower left back pain.  Patient has run out of metformin for 2 weeks.

## 2015-07-16 LAB — URINE CULTURE
Culture: >=100000
SPECIAL REQUESTS: NORMAL

## 2015-11-25 ENCOUNTER — Other Ambulatory Visit (HOSPITAL_COMMUNITY): Payer: Self-pay | Admitting: Bariatrics

## 2015-12-02 ENCOUNTER — Ambulatory Visit (HOSPITAL_COMMUNITY)
Admission: RE | Admit: 2015-12-02 | Discharge: 2015-12-02 | Disposition: A | Discharge status: Home / Self Care | Payer: 59 | Source: Ambulatory Visit | Attending: Bariatrics | Admitting: Bariatrics

## 2015-12-02 ENCOUNTER — Other Ambulatory Visit (HOSPITAL_COMMUNITY): Payer: Self-pay | Admitting: Bariatrics

## 2015-12-02 ENCOUNTER — Other Ambulatory Visit: Payer: Self-pay

## 2015-12-02 DIAGNOSIS — K8020 Calculus of gallbladder without cholecystitis without obstruction: Secondary | ICD-10-CM | POA: Diagnosis not present

## 2015-12-02 DIAGNOSIS — R932 Abnormal findings on diagnostic imaging of liver and biliary tract: Secondary | ICD-10-CM | POA: Diagnosis not present

## 2016-01-01 ENCOUNTER — Other Ambulatory Visit: Payer: Self-pay

## 2016-01-01 ENCOUNTER — Telehealth: Payer: Self-pay

## 2016-01-01 NOTE — Telephone Encounter (Signed)
Pt scheduled for EGD at [facility] on 02/23/16. GERD K21.9. Please precert

## 2016-04-04 ENCOUNTER — Encounter: Payer: Self-pay | Admitting: *Deleted

## 2016-04-05 ENCOUNTER — Encounter
Admission: RE | Disposition: A | Discharge status: Home / Self Care | Payer: Self-pay | Source: Ambulatory Visit | Attending: Gastroenterology

## 2016-04-05 ENCOUNTER — Ambulatory Visit: Payer: 59 | Admitting: Anesthesiology

## 2016-04-05 ENCOUNTER — Ambulatory Visit
Admission: RE | Admit: 2016-04-05 | Discharge: 2016-04-05 | Disposition: A | Discharge status: Home / Self Care | Payer: 59 | Source: Ambulatory Visit | Attending: Gastroenterology | Admitting: Gastroenterology

## 2016-04-05 DIAGNOSIS — Z888 Allergy status to other drugs, medicaments and biological substances status: Secondary | ICD-10-CM | POA: Diagnosis not present

## 2016-04-05 DIAGNOSIS — Z87891 Personal history of nicotine dependence: Secondary | ICD-10-CM | POA: Insufficient documentation

## 2016-04-05 DIAGNOSIS — E119 Type 2 diabetes mellitus without complications: Secondary | ICD-10-CM | POA: Insufficient documentation

## 2016-04-05 DIAGNOSIS — E7800 Pure hypercholesterolemia, unspecified: Secondary | ICD-10-CM | POA: Insufficient documentation

## 2016-04-05 DIAGNOSIS — K2950 Unspecified chronic gastritis without bleeding: Secondary | ICD-10-CM | POA: Insufficient documentation

## 2016-04-05 DIAGNOSIS — R12 Heartburn: Secondary | ICD-10-CM

## 2016-04-05 DIAGNOSIS — Z880 Allergy status to penicillin: Secondary | ICD-10-CM | POA: Insufficient documentation

## 2016-04-05 DIAGNOSIS — Z79899 Other long term (current) drug therapy: Secondary | ICD-10-CM | POA: Insufficient documentation

## 2016-04-05 DIAGNOSIS — K219 Gastro-esophageal reflux disease without esophagitis: Secondary | ICD-10-CM | POA: Diagnosis present

## 2016-04-05 DIAGNOSIS — K2970 Gastritis, unspecified, without bleeding: Secondary | ICD-10-CM | POA: Diagnosis not present

## 2016-04-05 DIAGNOSIS — Z9102 Food additives allergy status: Secondary | ICD-10-CM | POA: Diagnosis not present

## 2016-04-05 DIAGNOSIS — Z7984 Long term (current) use of oral hypoglycemic drugs: Secondary | ICD-10-CM | POA: Insufficient documentation

## 2016-04-05 HISTORY — PX: ESOPHAGOGASTRODUODENOSCOPY (EGD) WITH PROPOFOL: SHX5813

## 2016-04-05 LAB — GLUCOSE, CAPILLARY: GLUCOSE-CAPILLARY: 114 mg/dL — AB (ref 65–99)

## 2016-04-05 SURGERY — ESOPHAGOGASTRODUODENOSCOPY (EGD) WITH PROPOFOL
Anesthesia: General

## 2016-04-05 MED ORDER — MIDAZOLAM HCL 2 MG/2ML IJ SOLN
INTRAMUSCULAR | Status: DC | PRN
  Administered 2016-04-05: 1 mg via INTRAVENOUS

## 2016-04-05 MED ORDER — PROPOFOL 500 MG/50ML IV EMUL
INTRAVENOUS | Status: DC | PRN
  Administered 2016-04-05: 50 ug/kg/min via INTRAVENOUS

## 2016-04-05 MED ORDER — PROPOFOL 10 MG/ML IV BOLUS
INTRAVENOUS | Status: DC | PRN
  Administered 2016-04-05: 120 mg via INTRAVENOUS

## 2016-04-05 MED ORDER — SODIUM CHLORIDE 0.9 % IV SOLN
INTRAVENOUS | Status: DC
  Administered 2016-04-05: 1000 mL via INTRAVENOUS

## 2016-04-05 MED ORDER — SODIUM CHLORIDE 0.9 % IV SOLN: INTRAVENOUS | Status: DC

## 2016-04-05 NOTE — Op Note (Signed)
[facility] Patient Name: Helen Neal Procedure Date: 04/05/2016 8:56 AM MRN: [id number] Account #: [id number] Date of Birth: 09-Jan-1976 Admit Type: Outpatient Age: 40 Room: [facility] ENDO ROOM 4 Gender: Female Note Status: Finalized Procedure:            Upper GI endoscopy Indications:          Heartburn Providers:            Midge Minium MD, MD Referring MD:         Geraldo Pitter MD (Referring MD) Medicines:            Propofol per Anesthesia Complications:        No immediate complications. Procedure:            Pre-Anesthesia Assessment:                       - Prior to the procedure, a History and Physical was                        performed, and patient medications and allergies were                        reviewed. The patient's tolerance of previous                        anesthesia was also reviewed. The risks and benefits of                        the procedure and the sedation options and risks were                        discussed with the patient. All questions were                        answered, and informed consent was obtained. Prior                        Anticoagulants: The patient has taken no previous                        anticoagulant or antiplatelet agents. ASA Grade                        Assessment: II - A patient with mild systemic disease.                        After reviewing the risks and benefits, the patient was                        deemed in satisfactory condition to undergo the                        procedure.                       After obtaining informed consent, the endoscope was                        passed under direct vision. Throughout the procedure,  the patient's blood pressure, pulse, and oxygen                        saturations were monitored continuously. The Endoscope                        was introduced through the mouth, and advanced to the   second part of duodenum. The upper GI endoscopy was                        accomplished without difficulty. The patient tolerated                        the procedure well. Findings:      The esophagus was normal.      Localized mild inflammation characterized by erythema was found in the       gastric antrum. Biopsies were taken with a cold forceps for histology.      The examined duodenum was normal. Impression:           - Normal esophagus.                       - Gastritis. Biopsied.                       - Normal examined duodenum. Recommendation:       - Await pathology results.                       - Discharge patient to home.                       - Resume previous diet.                       - Continue present medications. Procedure Code(s):    --- Professional ---                       [phone], Esophagogastroduodenoscopy, flexible, transoral;                        with biopsy, single or multiple Diagnosis Code(s):    --- Professional ---                       R12, Heartburn                       K29.70, Gastritis, unspecified, without bleeding CPT copyright 2016 American Medical Association. All rights reserved. The codes documented in this report are preliminary and upon coder review may  be revised to meet current compliance requirements. Midge Miniumarren Sanvika Cuttino MD, MD 04/05/2016 9:06:49 AM This report has been signed electronically. Number of Addenda: 0 Note Initiated On: 04/05/2016 8:56 AM      [facility]

## 2016-04-05 NOTE — H&P (Signed)
Midge Minium, MD [facility] [address] [place], [place] [address] Phone: [phone] Fax : [phone]  Primary Care Physician:  Geraldo Pitter, MD Primary Gastroenterologist:  Dr. Servando Snare  Pre-Procedure History & Physical: HPI:  Helen Neal is a 40 y.o. female is here for an endoscopy.   Past Medical History:  Diagnosis Date  . Diabetes mellitus   . Hypercholesteremia     Past Surgical History:  Procedure Laterality Date  . BACK SURGERY    . BACK SURGERY      Prior to Admission medications   Medication Sig Start Date End Date Taking? Authorizing Provider  HYDROcodone-acetaminophen (NORCO/VICODIN) 5-325 MG per tablet Take 2 tablets by mouth every 4 (four) hours as needed for pain. 05/17/13  Yes Fayrene Helper, PA-C  ibuprofen (ADVIL,MOTRIN) 800 MG tablet Take 1,600 mg by mouth every 8 (eight) hours as needed for pain.   Yes Historical Provider, MD  metFORMIN (GLUCOPHAGE) 500 MG tablet 1 tab bid with meals 07/14/15  Yes Hayden Rasmussen, NP  cephALEXin (KEFLEX) 500 MG capsule Take 1 capsule (500 mg total) by mouth 4 (four) times daily. Patient not taking: Reported on 04/05/2016 07/14/15   Hayden Rasmussen, NP  Liraglutide (VICTOZA) 18 MG/3ML SOPN Inject 18 mg into the skin daily.    Historical Provider, MD  naproxen (NAPROSYN) 500 MG tablet Take 1 tablet (500 mg total) by mouth 2 (two) times daily. Patient not taking: Reported on 04/05/2016 02/22/14   Tatyana Kirichenko, PA-C  Olmesartan-Amlodipine-HCTZ (TRIBENZOR) 40-10-12.5 MG TABS Take 1 tablet by mouth daily.    Historical Provider, MD  rosuvastatin (CRESTOR) 10 MG tablet Take 10 mg by mouth daily.    Historical Provider, MD    Allergies as of 01/01/2016 - Review Complete 07/14/2015  Allergen Reaction Noted  . Mushroom extract complex Swelling 11/25/2015  . Penicillins Other (See Comments) 01/01/2012  . Relafen [nabumetone] Hives 07/25/2011    History reviewed. No pertinent family history.  Social History   Social History  .  Marital status: Single    Spouse name: N/A  . Number of children: N/A  . Years of education: N/A   Occupational History  . Not on file.   Social History Main Topics  . Smoking status: Former Smoker    Packs/day: 0.50    Quit date: 10/29/2012  . Smokeless tobacco: Not on file  . Alcohol use Yes     Comment: Occasional  . Drug use: No  . Sexual activity: Yes    Birth control/ protection: None   Other Topics Concern  . Not on file   Social History Narrative  . No narrative on file    Review of Systems: See HPI, otherwise negative ROS  Physical Exam: BP 139/87   Pulse 86   Temp 98.5 F (36.9 C) (Tympanic)   Resp 18   Ht 5' 6.5" (1.689 m)   Wt 268 lb (121.6 kg)   SpO2 100%   BMI 42.61 kg/m  General:   Alert,  pleasant and cooperative in NAD Head:  Normocephalic and atraumatic. Neck:  Supple; no masses or thyromegaly. Lungs:  Clear throughout to auscultation.    Heart:  Regular rate and rhythm. Abdomen:  Soft, nontender and nondistended. Normal bowel sounds, without guarding, and without rebound.   Neurologic:  Alert and  oriented x4;  grossly normal neurologically.  Impression/Plan: Helen Neal is here for an endoscopy to be performed for GERD  Risks, benefits, limitations, and alternatives regarding  endoscopy have been reviewed with the  patient.  Questions have been answered.  All parties agreeable.   Midge Miniumarren Jacobo Moncrief, MD  04/05/2016, 8:55 AM

## 2016-04-05 NOTE — Transfer of Care (Signed)
Immediate Anesthesia Transfer of Care Note  Patient: Helen Neal  Procedure(s) Performed: Procedure(s): ESOPHAGOGASTRODUODENOSCOPY (EGD) WITH PROPOFOL (N/A)  Patient Location: PACU  Anesthesia Type:General  Level of Consciousness: awake, alert  and oriented  Airway & Oxygen Therapy: Patient Spontanous Breathing and Patient connected to nasal cannula oxygen  Post-op Assessment: Report given to RN and Post -op Vital signs reviewed and stable  Post vital signs: Reviewed  Last Vitals:  Vitals:   04/05/16 0813  BP: 139/87  Pulse: 86  Resp: 18  Temp: 36.9 C    Last Pain:  Vitals:   04/05/16 0813  TempSrc: Tympanic         Complications: No apparent anesthesia complications

## 2016-04-05 NOTE — Anesthesia Postprocedure Evaluation (Signed)
Anesthesia Post Note  Patient: Engineer, [name]  Procedure(s) Performed: Procedure(s) (LRB): ESOPHAGOGASTRODUODENOSCOPY (EGD) WITH PROPOFOL (N/A)  Patient location during evaluation: PACU Anesthesia Type: General Level of consciousness: awake and alert Pain management: satisfactory to patient Vital Signs Assessment: post-procedure vital signs reviewed and stable Respiratory status: spontaneous breathing Cardiovascular status: stable Anesthetic complications: no    Last Vitals:  Vitals:   04/05/16 0930 04/05/16 0940  BP: (!) 127/91 137/88  Pulse: 84 80  Resp: 20 19  Temp:      Last Pain:  Vitals:   04/05/16 0910  TempSrc: Tympanic                 VAN STAVEREN,Melizza Kanode

## 2016-04-05 NOTE — Anesthesia Preprocedure Evaluation (Signed)
Anesthesia Evaluation  Patient identified by MRN, date of birth, ID band Patient awake    Reviewed: Allergy & Precautions, NPO status , Patient's Chart, lab work & pertinent test results  Airway Mallampati: II       Dental  (+) Teeth Intact   Pulmonary neg pulmonary ROS, former smoker,    breath sounds clear to auscultation       Cardiovascular Exercise Tolerance: Good  Rhythm:Regular     Neuro/Psych    GI/Hepatic negative GI ROS,   Endo/Other  diabetesMorbid obesity  Renal/GU      Musculoskeletal negative musculoskeletal ROS (+)   Abdominal (+) + obese,   Peds negative pediatric ROS (+)  Hematology negative hematology ROS (+)   Anesthesia Other Findings   Reproductive/Obstetrics                            Anesthesia Physical Anesthesia Plan  ASA: III  Anesthesia Plan: General   Post-op Pain Management:    Induction: Intravenous  Airway Management Planned: Natural Airway and Nasal Cannula  Additional Equipment:   Intra-op Plan:   Post-operative Plan:   Informed Consent: I have reviewed the patients History and Physical, chart, labs and discussed the procedure including the risks, benefits and alternatives for the proposed anesthesia with the patient or authorized representative who has indicated his/her understanding and acceptance.     Plan Discussed with:   Anesthesia Plan Comments:         Anesthesia Quick Evaluation

## 2016-04-06 ENCOUNTER — Encounter: Payer: Self-pay | Admitting: Gastroenterology

## 2016-04-06 LAB — SURGICAL PATHOLOGY

## 2016-04-12 ENCOUNTER — Encounter: Payer: Self-pay | Admitting: Gastroenterology

## 2017-04-14 ENCOUNTER — Encounter (HOSPITAL_COMMUNITY): Payer: Self-pay | Admitting: Emergency Medicine

## 2017-04-14 ENCOUNTER — Emergency Department (HOSPITAL_COMMUNITY): Payer: 59

## 2017-04-14 ENCOUNTER — Emergency Department (HOSPITAL_COMMUNITY)
Admission: EM | Admit: 2017-04-14 | Discharge: 2017-04-14 | Disposition: A | Discharge status: Home / Self Care | Payer: 59 | Attending: Emergency Medicine | Admitting: Emergency Medicine

## 2017-04-14 DIAGNOSIS — Z87891 Personal history of nicotine dependence: Secondary | ICD-10-CM | POA: Insufficient documentation

## 2017-04-14 DIAGNOSIS — Z79899 Other long term (current) drug therapy: Secondary | ICD-10-CM | POA: Diagnosis not present

## 2017-04-14 DIAGNOSIS — Y999 Unspecified external cause status: Secondary | ICD-10-CM | POA: Diagnosis not present

## 2017-04-14 DIAGNOSIS — W19XXXA Unspecified fall, initial encounter: Secondary | ICD-10-CM | POA: Diagnosis not present

## 2017-04-14 DIAGNOSIS — Y92512 Supermarket, store or market as the place of occurrence of the external cause: Secondary | ICD-10-CM | POA: Insufficient documentation

## 2017-04-14 DIAGNOSIS — R55 Syncope and collapse: Secondary | ICD-10-CM | POA: Diagnosis present

## 2017-04-14 DIAGNOSIS — E119 Type 2 diabetes mellitus without complications: Secondary | ICD-10-CM | POA: Diagnosis not present

## 2017-04-14 DIAGNOSIS — S20211A Contusion of right front wall of thorax, initial encounter: Secondary | ICD-10-CM

## 2017-04-14 DIAGNOSIS — Y9389 Activity, other specified: Secondary | ICD-10-CM | POA: Diagnosis not present

## 2017-04-14 DIAGNOSIS — Z7984 Long term (current) use of oral hypoglycemic drugs: Secondary | ICD-10-CM | POA: Insufficient documentation

## 2017-04-14 DIAGNOSIS — R52 Pain, unspecified: Secondary | ICD-10-CM

## 2017-04-14 LAB — I-STAT BETA HCG BLOOD, ED (MC, WL, AP ONLY): I-stat hCG, quantitative: <5 m[IU]/mL (ref <5)

## 2017-04-14 LAB — BASIC METABOLIC PANEL
ANION GAP: 9 (ref 5–15)
BUN: 10 mg/dL (ref 6–20)
CALCIUM: 8.5 mg/dL — AB (ref 8.9–10.3)
CO2: 20 mmol/L — AB (ref 22–32)
CREATININE: 0.72 mg/dL (ref 0.44–1.00)
Chloride: 109 mmol/L (ref 101–111)
GFR calc Af Amer: >60 mL/min (ref >60)
GLUCOSE: 121 mg/dL — AB (ref 65–99)
Potassium: 3.6 mmol/L (ref 3.5–5.1)
Sodium: 138 mmol/L (ref 135–145)

## 2017-04-14 LAB — CBG MONITORING, ED: GLUCOSE-CAPILLARY: 118 mg/dL — AB (ref 65–99)

## 2017-04-14 LAB — CBC
HCT: 31.6 % — ABNORMAL LOW (ref 36.0–46.0)
Hemoglobin: 9.7 g/dL — ABNORMAL LOW (ref 12.0–15.0)
MCH: 25.2 pg — AB (ref 26.0–34.0)
MCHC: 30.7 g/dL (ref 30.0–36.0)
MCV: 82.1 fL (ref 78.0–100.0)
PLATELETS: 161 10*3/uL (ref 150–400)
RBC: 3.85 MIL/uL — ABNORMAL LOW (ref 3.87–5.11)
RDW: 16 % — AB (ref 11.5–15.5)
WBC: 8.1 10*3/uL (ref 4.0–10.5)

## 2017-04-14 LAB — I-STAT TROPONIN, ED: Troponin i, poc: 0 ng/mL (ref 0.00–0.08)

## 2017-04-14 MED ORDER — ACETAMINOPHEN ER 650 MG PO TBCR: 650.0000 mg | EXTENDED_RELEASE_TABLET | Freq: Three times a day (TID) | ORAL | 0 refills | Status: AC | PRN

## 2017-04-14 MED ORDER — HYDROCODONE-ACETAMINOPHEN 5-325 MG PO TABS
1.0000 | ORAL_TABLET | Freq: Once | ORAL | Status: AC
  Administered 2017-04-14: 1 via ORAL
  Filled 2017-04-14: qty 1

## 2017-04-14 NOTE — ED Triage Notes (Signed)
Brought in by EMS from place of work with c/o syncope.  Pt reported that she was at work drinking a soda when she got dizzy and "totally passed out", and fell, hitting her back on a furniture, sustaining laceration on her mid-back.  Pt also c/o right shoulder pain with limited range of motion---- no obvious deformity noted.  Pt arrived to ED fully awake and A/O x 4, c-collar in place.  Pt's CBG by EMS was 128.

## 2017-04-14 NOTE — Discharge Instructions (Signed)
All the results in the ER are normal, labs and imaging. We are not sure what is causing you to faint, we dont suspect any life threatening cardiac arrhythmia at this time. The workup in the ER is not complete, and is limited to screening for life threatening and emergent conditions only, so please see a primary care doctor for further evaluation.  Return to the Er if your symptoms return.  [place] law prevents people with seizures or fainting from driving or operating dangerous machinery until they are free of seizures or fainting for 6 months.

## 2017-04-14 NOTE — ED Notes (Signed)
Bed: WU98 Expected date:  Expected time:  Means of arrival:  Comments: EMS dizzy normal CBG/spinal restriction due to fall

## 2017-04-14 NOTE — ED Provider Notes (Signed)
[facility]-EMERGENCY DEPT Provider Note   CSN: [id number] Arrival date & time: 04/14/17  2007     History   Chief Complaint Chief Complaint  Patient presents with  . Loss of Consciousness    HPI Helen Neal is a 41 y.o. female.  HPI Pt comes in with cc of fainting. Pt has hx of DM and HL. She reports that she was at work [employer] stage [employer]) when she suddenly started feeling dizzy, sweating and then fainted. Pt has no recent syncope. She had no chest pain, palpitations, dib.   Past Medical History:  Diagnosis Date  . Diabetes mellitus   . Hypercholesteremia     Patient Active Problem List   Diagnosis Date Noted  . Heartburn   . Gastritis     Past Surgical History:  Procedure Laterality Date  . BACK SURGERY    . BACK SURGERY    . ESOPHAGOGASTRODUODENOSCOPY (EGD) WITH PROPOFOL N/A 04/05/2016   Procedure: ESOPHAGOGASTRODUODENOSCOPY (EGD) WITH PROPOFOL;  Surgeon: Midge Minium, MD;  Location: [facility] ENDOSCOPY;  Service: Endoscopy;  Laterality: N/A;    OB History    No data available       Home Medications    Prior to Admission medications   Medication Sig Start Date End Date Taking? Authorizing Provider  metFORMIN (GLUCOPHAGE) 500 MG tablet 1 tab bid with meals Patient taking differently: Take 500 mg by mouth daily with breakfast.  07/14/15  Yes Mabe, David, NP  Multiple Vitamins-Minerals (MULTIVITAMIN ADULT PO) Take 1 tablet by mouth daily.   Yes [provider]  Multiple Vitamins-Minerals (MULTIVITAMIN ADULTS 50+) TABS Take 1 tablet by mouth daily.   Yes [provider]  acetaminophen (TYLENOL 8 HOUR) 650 MG CR tablet Take 1 tablet (650 mg total) by mouth every 8 (eight) hours as needed for pain. 04/14/17   Derwood Kaplan, MD  cephALEXin (KEFLEX) 500 MG capsule Take 1 capsule (500 mg total) by mouth 4 (four) times daily. Patient not taking: Reported on 04/05/2016 07/14/15   Hayden Rasmussen, NP  HYDROcodone-acetaminophen (NORCO/VICODIN) 5-325 MG per tablet Take 2  tablets by mouth every 4 (four) hours as needed for pain. Patient not taking: Reported on 04/14/2017 05/17/13   Fayrene Helper, PA-C  naproxen (NAPROSYN) 500 MG tablet Take 1 tablet (500 mg total) by mouth 2 (two) times daily. Patient not taking: Reported on 04/05/2016 02/22/14   Jaynie Crumble, PA-C    Family History History reviewed. No pertinent family history.  Social History Social History  Substance Use Topics  . Smoking status: Former Smoker    Packs/day: 0.50    Quit date: 10/29/2012  . Smokeless tobacco: Never Used  . Alcohol use Yes     Comment: Occasional     Allergies   Ibuprofen; Mushroom extract complex; Penicillins; and Relafen [nabumetone]   Review of Systems Review of Systems  Musculoskeletal: Positive for myalgias.  Neurological: Positive for syncope.  All other systems reviewed and are negative.    Physical Exam Updated Vital Signs BP 127/76   Pulse 84   Temp 98.3 F (36.8 C) (Oral)   Resp 18   Ht 5' 6.75" (1.695 m)   Wt 91.2 kg (201 lb)   LMP 02/22/2017   SpO2 100%   BMI 31.72 kg/m   Physical Exam  Constitutional: She is oriented to person, place, and time. She appears well-developed.  HENT:  Head: Normocephalic and atraumatic.  Eyes: EOM are normal.  Neck: Normal range of motion. Neck supple.  Cardiovascular: Normal rate.   Pulmonary/Chest:  Effort normal.  Abdominal: Bowel sounds are normal.  Musculoskeletal: She exhibits tenderness.  Neurological: She is alert and oriented to person, place, and time. No cranial nerve deficit. Coordination normal.  Skin: Skin is warm and dry.  Nursing note and vitals reviewed.    ED Treatments / Results  Labs (all labs ordered are listed, but only abnormal results are displayed) Labs Reviewed  BASIC METABOLIC PANEL - Abnormal; Notable for the following:       Result Value   CO2 20 (*)    Glucose, Bld 121 (*)    Calcium 8.5 (*)    All other components within normal limits  CBC - Abnormal;  Notable for the following:    RBC 3.85 (*)    Hemoglobin 9.7 (*)    HCT 31.6 (*)    MCH 25.2 (*)    RDW 16.0 (*)    All other components within normal limits  CBG MONITORING, ED - Abnormal; Notable for the following:    Glucose-Capillary 118 (*)    All other components within normal limits  URINALYSIS, ROUTINE W REFLEX MICROSCOPIC  I-STAT BETA HCG BLOOD, ED ([facility], [facility], [facility] ONLY)  I-STAT TROPONIN, ED    EKG  EKG Interpretation  Date/Time:  Friday April 14 2017 20:41:54 EDT Ventricular Rate:  74 PR Interval:    QRS Duration: 97 QT Interval:  394 QTC Calculation: 438 R Axis:   38 Text Interpretation:  Sinus rhythm No acute changes No significant change since last tracing Confirmed by Derwood Kaplan ([id number]) on 04/14/2017 9:25:14 PM       Radiology Dg Ribs Unilateral W/chest Right  Result Date: 04/14/2017 CLINICAL DATA:  Syncopal episode today at work. Fell on the posterior right ribs. Limited motion of the right shoulder. EXAM: RIGHT RIBS AND CHEST - 3+ VIEW COMPARISON:  Chest 12/02/2015 FINDINGS: Normal heart size and pulmonary vascularity. No focal airspace disease or consolidation in the lungs. No blunting of costophrenic angles. No pneumothorax. Mediastinal contours appear intact. Right ribs appear intact. No acute fracture or displacement is identified. No focal bone lesion or bone destruction. Visualized soft tissues are unremarkable. IMPRESSION: No evidence of active pulmonary disease.  Negative right ribs. Electronically Signed   By: Burman Nieves M.D.   On: 04/14/2017 22:22   Dg Scapula Right  Result Date: 04/14/2017 CLINICAL DATA:  Pt had syncopal episode today while at work. States she fell and hit the metal corner of the safe at work on her posterior right ribs. Also c/o limited movement of right shoulder s/p fall. EXAM: RIGHT SCAPULA - 2+ VIEWS COMPARISON:  None. FINDINGS: Glenohumeral joint is intact. No evidence of scapular fracture or humeral fracture. The  acromioclavicular joint is intact. No rib fracture evident IMPRESSION: No fracture or dislocation. Electronically Signed   By: Genevive Bi M.D.   On: 04/14/2017 22:21    Procedures Procedures (including critical care time)  Medications Ordered in ED Medications  HYDROcodone-acetaminophen (NORCO/VICODIN) 5-325 MG per tablet 1 tablet (1 tablet Oral Given 04/14/17 2134)     Initial Impression / Assessment and Plan / ED Course  I have reviewed the triage vital signs and the nursing notes.  Pertinent labs & imaging results that were available during my care of the patient were reviewed by me and considered in my medical decision making (see chart for details).     Pt comes in after syncope. DDx includes: Orthostatic hypotension Dysrhythmia PE Vasovagal/neurocardiogenic syncope Anemia hypoglycemia  Likely vasovagal event, based on the prodrome.  Pt has some R sided posterior rib pain, will get xrays. Doubt fracture.  Final Clinical Impressions(s) / ED Diagnoses   Final diagnoses:  Pain  Syncope and collapse  Contusion of rib on right side, initial encounter    New Prescriptions New Prescriptions   ACETAMINOPHEN (TYLENOL 8 HOUR) 650 MG CR TABLET    Take 1 tablet (650 mg total) by mouth every 8 (eight) hours as needed for pain.     Derwood Kaplan, MD 04/14/17 2340

## 2017-11-09 ENCOUNTER — Ambulatory Visit: Payer: Self-pay | Admitting: Family Medicine

## 2017-11-09 DIAGNOSIS — Z2089 Contact with and (suspected) exposure to other communicable diseases: Secondary | ICD-10-CM

## 2018-07-16 IMAGING — CR DG RIBS W/ CHEST 3+V*R*
5 series · 5 of 5 positions shown · non-contrast
Comparison: Chest 12/02/2015

CLINICAL DATA: Syncopal episode today at work. Fell on the
posterior right ribs. Limited motion of the right shoulder.

EXAM:
RIGHT RIBS AND CHEST - 3+ VIEW

[t ribs ap lower right (1 of 2)]
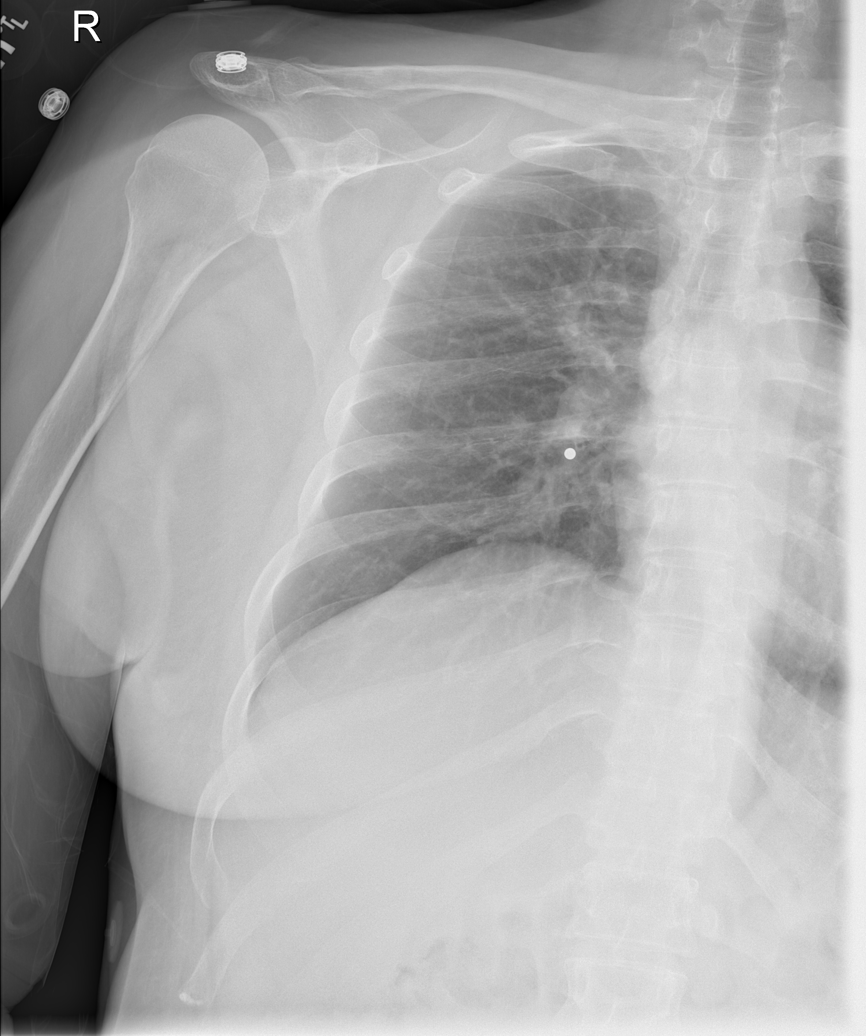

[t ribs ap lower right (2 of 2)]
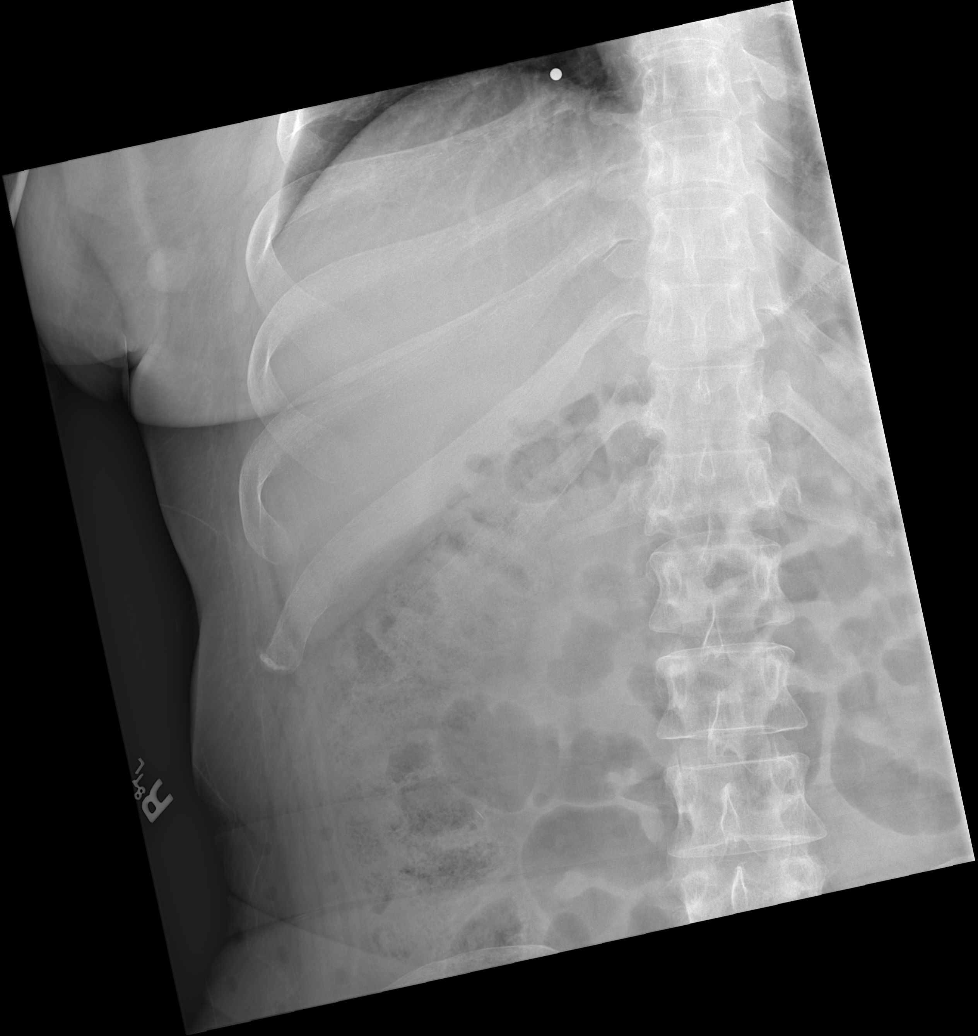

[t ribs rpo right (1 of 2)]
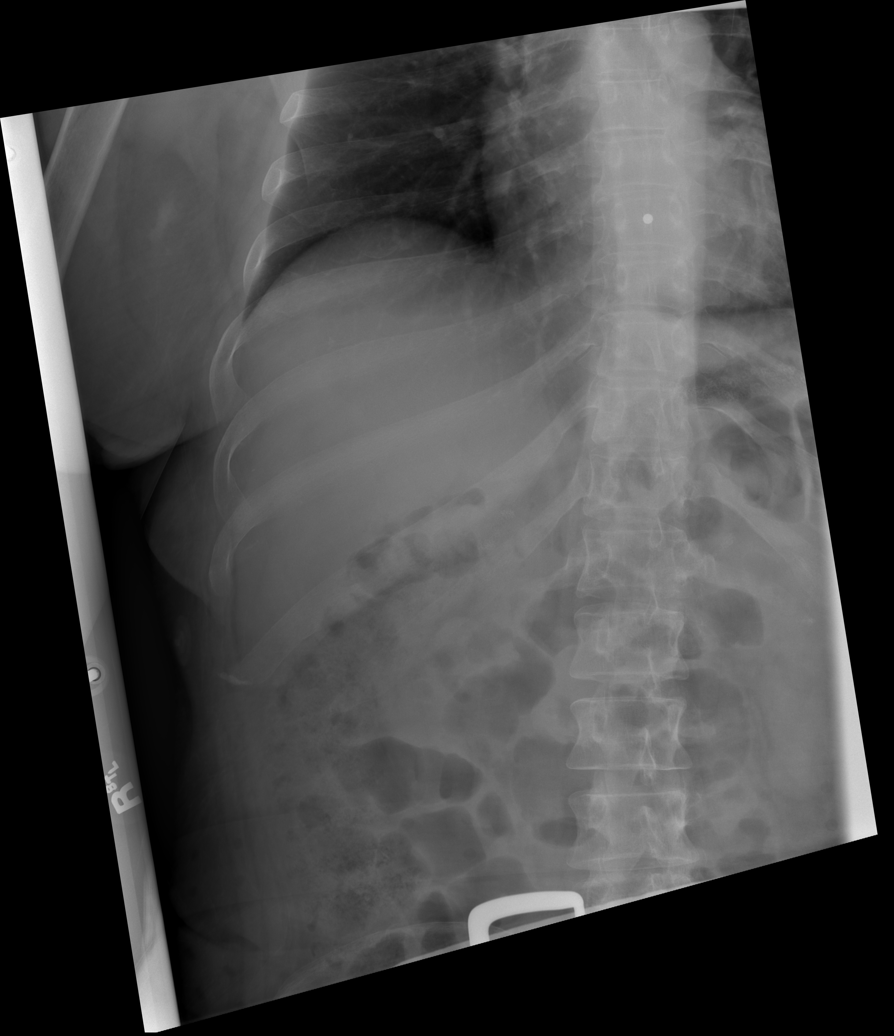

[t ribs rpo right (2 of 2)]
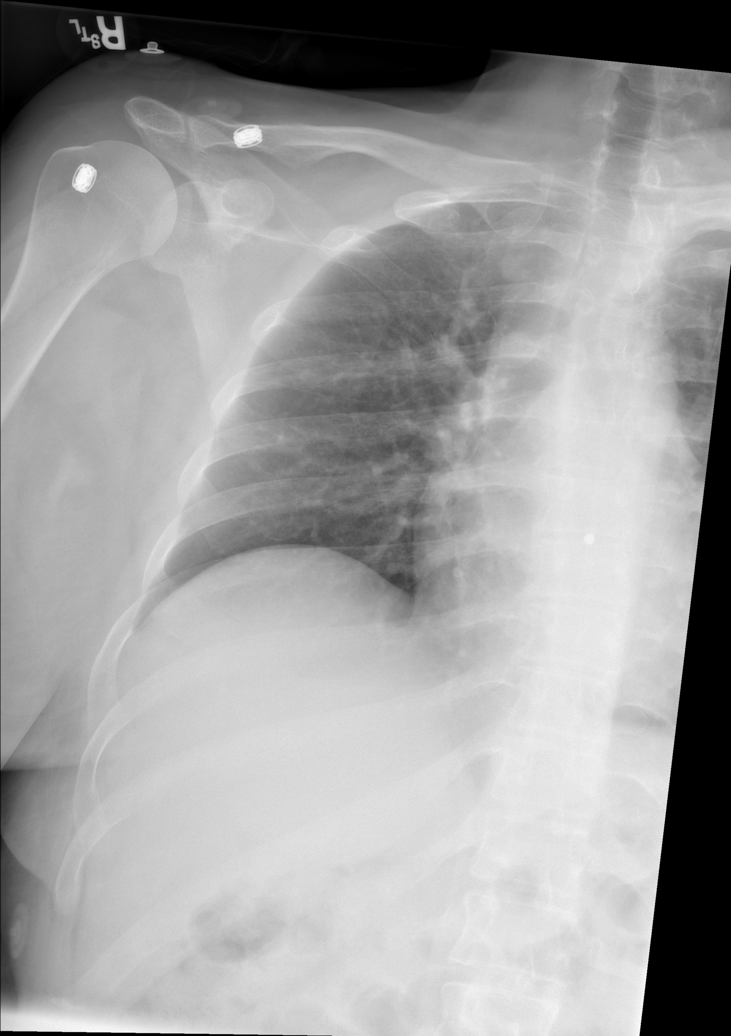

[x chest ap]
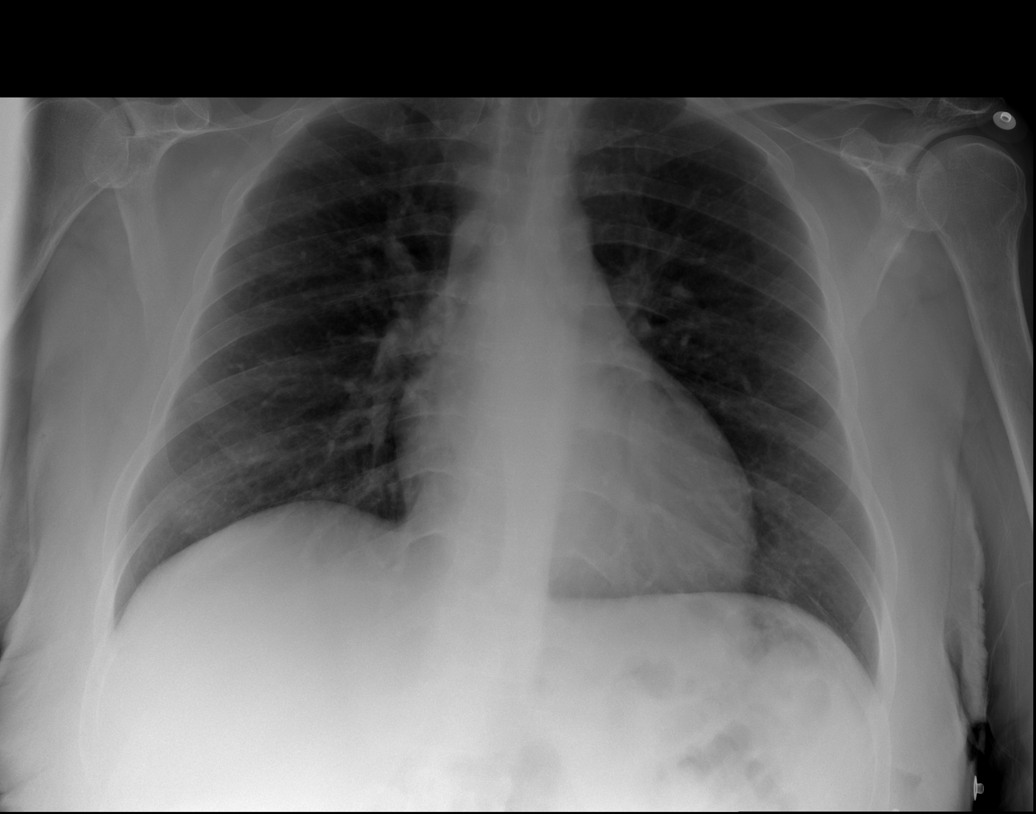

[5 of 5 positions shown; findings below may reference images not displayed]

FINDINGS: Normal heart size and pulmonary vascularity. No focal airspace
disease or consolidation in the lungs. No blunting of costophrenic
angles. No pneumothorax. Mediastinal contours appear intact.

Right ribs appear intact. No acute fracture or displacement is
identified. No focal bone lesion or bone destruction. Visualized
soft tissues are unremarkable.
IMPRESSION: No evidence of active pulmonary disease.  Negative right ribs.

## 2018-11-07 ENCOUNTER — Other Ambulatory Visit: Payer: Self-pay

## 2018-11-07 NOTE — Telephone Encounter (Signed)
Opened in error

## 2023-09-07 DIAGNOSIS — H02824 Cysts of left upper eyelid: Secondary | ICD-10-CM | POA: Diagnosis not present

## 2023-09-07 DIAGNOSIS — E66811 Obesity, class 1: Secondary | ICD-10-CM | POA: Diagnosis not present

## 2023-09-07 DIAGNOSIS — Z6834 Body mass index (BMI) 34.0-34.9, adult: Secondary | ICD-10-CM | POA: Diagnosis not present

## 2023-09-15 DIAGNOSIS — E559 Vitamin D deficiency, unspecified: Secondary | ICD-10-CM | POA: Diagnosis not present

## 2023-09-15 DIAGNOSIS — E119 Type 2 diabetes mellitus without complications: Secondary | ICD-10-CM | POA: Diagnosis not present

## 2023-09-15 DIAGNOSIS — Z6834 Body mass index (BMI) 34.0-34.9, adult: Secondary | ICD-10-CM | POA: Diagnosis not present

## 2023-09-15 DIAGNOSIS — F419 Anxiety disorder, unspecified: Secondary | ICD-10-CM | POA: Diagnosis not present

## 2023-09-15 DIAGNOSIS — E66811 Obesity, class 1: Secondary | ICD-10-CM | POA: Diagnosis not present

## 2024-04-12 DIAGNOSIS — N6313 Unspecified lump in the right breast, lower outer quadrant: Secondary | ICD-10-CM | POA: Diagnosis not present
